# Patient Record
Sex: Female | Born: 1990 | Race: White | Hispanic: No | Marital: Married | Smoking: Current some day smoker
Health system: Southern US, Community
[De-identification: ages and names within clinical notes are randomized; demographics above are authoritative.]

## PROBLEM LIST (undated history)

## (undated) DIAGNOSIS — D649 Anemia, unspecified: Secondary | ICD-10-CM

## (undated) HISTORY — PX: WISDOM TOOTH EXTRACTION: SHX21

## (undated) HISTORY — PX: TONSILLECTOMY: SUR1361

---
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
## 2008-01-21 ENCOUNTER — Emergency Department: Payer: Self-pay | Admitting: Emergency Medicine

## 2008-05-03 ENCOUNTER — Emergency Department: Payer: Self-pay | Admitting: Emergency Medicine

## 2008-07-07 ENCOUNTER — Emergency Department: Payer: Self-pay | Admitting: Emergency Medicine

## 2010-01-31 ENCOUNTER — Emergency Department: Payer: Self-pay | Admitting: Emergency Medicine

## 2010-12-15 ENCOUNTER — Emergency Department: Payer: Self-pay | Admitting: Emergency Medicine

## 2011-01-10 NOTE — H&P (Signed)
Leah Lozano, Leah Lozano NO.:  [id number]   MEDICAL RECORD NO.:  [id number]          PATIENT TYPE:  INP   LOCATION:  0106                          FACILITY:  [facility]   PHYSICIAN:  Lalla Brothers, MDDATE OF BIRTH:  01/13/1991   DATE OF ADMISSION:  01/15/2007  DATE OF DISCHARGE:                       PSYCHIATRIC ADMISSION ASSESSMENT   IDENTIFICATION:  A 75-1/20-year-old female, eleventh grade student at  [employer] is admitted emergently voluntarily in  transfer from [facility] by  Samuel Jester for inpatient stabilization and treatment of suicide  risk and depression.  The patient's suicide plan is to kill with knives  or guns.  She ran away from grandmother's home for 24 hours and was  found by the police and brought to crisis.  The patient is defiant in  her cutting, piercing, and being suspended from school.  She will not  contract for safety.   HISTORY OF PRESENT ILLNESS:  The patient is hesitant to open up and  discuss any of her problems.  She will not discuss details of her  problematic childhood.  The patient resides with grandmother and they  report that mother works as a Research scientist (medical).  The patient is  most distressed currently by father making phone promises to reunite  with the patient in a family-type fashion and then breaking the promise  recently.  Father has abandoned the patient for many years according to  the family.  The patient has apparently had the breakup of other peer  relationships recently that recapitulate the losses of father.  The  patient has diminished concentration, diminished eating, and diminished  sleeping.  She has a suicide plan and morbid fixations.  She will not  contract for safety.  She states she does not want any medication  treatment.  She reports a fear of clowns.  She denies specific trauma.  Patient's grandmother is worried that the patient has  substance abuse  concerns though the patient denies..  The patient reportedly has seen or  is to see Dr. Ronne Binning at [facility] for assessment  and treatment.  The patient apparently does not want any part of such  assessment and treatment.  The patient seems intelligent but she  suggests she does not know much about her schoolwork or her grades.  She  suggests she will have to find out.  The patient suggests she has been  exposed to some friends with HIV and therefore she may have a number of  reminders of loss, abandonment, and trauma.   PAST MEDICAL HISTORY:  The patient is under the primary care of Dr.  Lucious Groves.  She reports exposure to friends with HIV but does not clarify  type of exposure or who the friends are.  The patient reportedly had a  recent surgical excision of a cyst in the neck or throat.  She does not  acknowledge any consequences but does not acknowledge the pathology  conclusions.  Last GYN exam was this year.  Last dental exam was last  year.  She has constipation.  She has lacerations of the left forearm  currently that are self-inflicted in her cutting.  She has some benign  ecchymoses of the right leg but does not know how she obtained it.  She  has allergy to CODEINE.  She is on no current medications.  She had no  seizure or syncope.  She had no heart murmur or arrhythmia.  She denies  other organic central nervous system trauma.   REVIEW OF SYSTEMS:  The patient denies difficulty with gait, gaze or  continence.  She denies exposure to communicable disease or toxins.  She  denies rash, jaundice or purpura.  There is no chest pain, palpitations  or presyncope currently.  There is no abdominal pain, nausea, vomiting  or diarrhea.  There is no dysuria or arthralgia.  There is no headache  or sensory loss.  There is no memory loss or coordination deficit.   IMMUNIZATIONS:  Up to date.   FAMILY HISTORY:  The patient resides with grandmother and  wants to  remain with her.  However, she was tempted to reunite with father when  he called after years of not being there for her recently.  Father did  not attend scheduled rendezvous, and the patient feels like she has  recapitulated and reexperienced her losses over and over.  The patient  has apparently moved to [place] from [place].  Mother and father  are not present in her life and the patient will not admit to the  consequences or needs.   SOCIAL AND DEVELOPMENTAL HISTORY:  The patient is an eleventh grade  student at [employer].  She reports she is currently  suspended from school.  She states she does not know about her status at  school including grades.  However, she is certainly intelligent enough  to know.  The patient does not acknowledge other legal charges  currently.  She does not acknowledge any substance abuse though  grandmother is suspicious for such.  GYN care is up to date this year  and she does not answer questions about sexual activity though she seems  likely sexually active by these indirect conclusions.  The patient does  not acknowledge use of cigarettes currently.  However, she is not open  or disclosing about most questions.   ASSETS:  The patient has some superego function evident though she  obscures and covers up that as well.   MENTAL STATUS EXAM:  Height is 63 inches and weight is 62 kg.  Blood  pressure is 106/69 with heart rate of 70 sitting, and 117/73 with heart  rate of 79 standing.  She is right-handed.  She is alert and oriented  with speech intact.  Cranial nerves, II through XII, intact.  Muscle  strength and tone are normal.  There are no pathologic reflexes or soft  neurologic findings.  There are no abnormal involuntary movements.  Gait  and gaze are intact.  The patient is retentive, obsessive, and  devaluing.  She tends to hold in strong negative emotions and stores up negative conclusions.  She has severe  dysphoria but suggests she is  currently not wanting help.  She has an avoidant style as well, likely  attended to mother's either substance use or traumatic social behavior.  The patient reports no anxiety otherwise, except the fear of clowns.  She has no psychosis or mania evident.  She has no posttraumatic stress  or organicity clearly evident.  She does have severe dysphoria  though  with reactive mood.  She has suicidal ideation though with nonspecific  plans such as guns and knives and cutting.  She is not homicidal.   IMPRESSION:  AXIS I:  1. Major depression, single episode, severe with atypical features.  2. Oppositional defiant disorder with obsessive and compulsive      features.  3. Parent-child problem.  4. Other specified family circumstances.  5. Other interpersonal problem.  6. Noncompliance with current treatment.  AXIS II:  Diagnosis deferred.  AXIS III:  1. Lacerations, left forearm.  2. Allergy to CODEINE.  3. Status post recent neck or throat surgery.  4. History of constipation.  5. Unknown exposure to friends with human immunodeficiency virus.  AXIS IV:  Stressors:  Family, severe acute and chronic; phase of life,  severe acute and chronic; school, moderate acute and chronic.  AXIS V:  Global assessment of functioning on admission 38 with highest  in the last year 73.   PLAN:  The patient is admitted for inpatient adolescent psychiatric and  multidisciplinary multimodal behavioral treatment in a team-based  problematic locked psychiatric unit.  Luvox or Celexa pharmacotherapy  can be considered though the patient currently refuses to consider any  medication.  Cognitive behavioral therapy, anger management,  interpersonal therapy, substance abuse prevention, social and  communication skill training, problem solving and coping skill training,  family intervention, grief and loss and learning strategies can be  undertaken.  Estimated length  stay is 3-5 days  with target symptoms for discharge being stabilization  of suicide risk and mood, stabilization of dangerous disruptive  behavior, and generalization of the capacity for safe effective  participation in outpatient treatment.      Lalla Brothers, MD  Electronically Signed     GEJ/MEDQ  D:  01/16/2007  T:  01/17/2007  Job:  [id number]

## 2011-01-11 ENCOUNTER — Emergency Department: Payer: Self-pay | Admitting: Emergency Medicine
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
## 2011-03-25 ENCOUNTER — Emergency Department: Payer: Self-pay | Admitting: Unknown Physician Specialty

## 2011-04-08 ENCOUNTER — Emergency Department: Payer: Self-pay | Admitting: Emergency Medicine

## 2011-09-04 ENCOUNTER — Emergency Department: Payer: Self-pay | Admitting: *Deleted

## 2012-02-23 ENCOUNTER — Emergency Department: Payer: Self-pay | Admitting: Emergency Medicine

## 2012-02-23 LAB — URINALYSIS, COMPLETE
Ketone: NEGATIVE
Leukocyte Esterase: NEGATIVE
Nitrite: NEGATIVE
Ph: 5 (ref 4.5–8.0)
Protein: 30
WBC UR: 4 /HPF (ref 0–5)

## 2012-02-23 LAB — CBC
HCT: 41.3 % (ref 35.0–47.0)
MCH: 27.3 pg (ref 26.0–34.0)
MCHC: 32.6 g/dL (ref 32.0–36.0)
Platelet: 320 10*3/uL (ref 150–440)
RBC: 4.93 10*6/uL (ref 3.80–5.20)
RDW: 14.4 % (ref 11.5–14.5)

## 2012-02-26 ENCOUNTER — Emergency Department: Payer: Self-pay | Admitting: Emergency Medicine

## 2012-02-26 ENCOUNTER — Other Ambulatory Visit: Payer: Self-pay | Admitting: Obstetrics and Gynecology

## 2012-02-26 LAB — HCG, QUANTITATIVE, PREGNANCY: Beta Hcg, Quant.: 4 m[IU]/mL — ABNORMAL HIGH

## 2012-02-26 LAB — URINALYSIS, COMPLETE
Bilirubin,UR: NEGATIVE
Ketone: NEGATIVE
Ph: 8 (ref 4.5–8.0)
Squamous Epithelial: 3

## 2012-02-26 LAB — CBC
HCT: 41.9 % (ref 35.0–47.0)
HGB: 13.4 g/dL (ref 12.0–16.0)
MCV: 84 fL (ref 80–100)
RBC: 5.02 10*6/uL (ref 3.80–5.20)
RDW: 14.3 % (ref 11.5–14.5)
WBC: 15.5 10*3/uL — ABNORMAL HIGH (ref 3.6–11.0)

## 2012-02-26 LAB — BASIC METABOLIC PANEL
Anion Gap: 5 — ABNORMAL LOW (ref 7–16)
Calcium, Total: 9 mg/dL (ref 8.5–10.1)
Co2: 28 mmol/L (ref 21–32)
Creatinine: 1.08 mg/dL (ref 0.60–1.30)
EGFR (African American): >60
EGFR (Non-African Amer.): >60
Glucose: 96 mg/dL (ref 65–99)
Osmolality: 276 (ref 275–301)

## 2012-07-16 ENCOUNTER — Ambulatory Visit: Payer: Self-pay | Admitting: Internal Medicine

## 2012-07-16 LAB — COMPREHENSIVE METABOLIC PANEL
Albumin: 4 g/dL (ref 3.4–5.0)
Alkaline Phosphatase: 88 U/L (ref 50–136)
Anion Gap: 11 (ref 7–16)
BUN: 9 mg/dL (ref 7–18)
Chloride: 102 mmol/L (ref 98–107)
Creatinine: 0.97 mg/dL (ref 0.60–1.30)
Glucose: 88 mg/dL (ref 65–99)
SGOT(AST): 21 U/L (ref 15–37)
SGPT (ALT): 32 U/L (ref 12–78)
Sodium: 141 mmol/L (ref 136–145)
Total Protein: 8 g/dL (ref 6.4–8.2)

## 2012-07-16 LAB — CBC WITH DIFFERENTIAL/PLATELET
Basophil #: 0.2 10*3/uL — ABNORMAL HIGH (ref 0.0–0.1)
Basophil %: 1.2 %
Eosinophil #: 0.4 10*3/uL (ref 0.0–0.7)
HCT: 41.6 % (ref 35.0–47.0)
HGB: 13.8 g/dL (ref 12.0–16.0)
Lymphocyte %: 23.8 %
Monocyte %: 4.3 %
Neutrophil %: 67.7 %
RDW: 14 % (ref 11.5–14.5)
WBC: 13.7 10*3/uL — ABNORMAL HIGH (ref 3.6–11.0)

## 2012-12-29 ENCOUNTER — Emergency Department: Payer: Self-pay | Admitting: Emergency Medicine

## 2013-11-30 ENCOUNTER — Emergency Department: Payer: Self-pay | Admitting: Emergency Medicine

## 2014-02-17 ENCOUNTER — Ambulatory Visit: Payer: Self-pay | Admitting: Emergency Medicine

## 2015-08-16 ENCOUNTER — Encounter: Payer: Self-pay | Admitting: Emergency Medicine

## 2015-08-16 ENCOUNTER — Emergency Department
Admission: EM | Admit: 2015-08-16 | Discharge: 2015-08-16 | Disposition: A | Discharge status: Home / Self Care | Payer: Self-pay | Attending: Emergency Medicine | Admitting: Emergency Medicine

## 2015-08-16 DIAGNOSIS — F17200 Nicotine dependence, unspecified, uncomplicated: Secondary | ICD-10-CM | POA: Insufficient documentation

## 2015-08-16 DIAGNOSIS — J157 Pneumonia due to Mycoplasma pneumoniae: Secondary | ICD-10-CM | POA: Insufficient documentation

## 2015-08-16 MED ORDER — PREDNISONE 1 MG PO TABS: 1.0000 mg | ORAL_TABLET | Freq: Every day | ORAL | Status: DC

## 2015-08-16 MED ORDER — AZITHROMYCIN 250 MG PO TABS: ORAL_TABLET | ORAL | Status: DC

## 2015-08-16 MED ORDER — ALBUTEROL SULFATE HFA 108 (90 BASE) MCG/ACT IN AERS: 2.0000 | INHALATION_SPRAY | RESPIRATORY_TRACT | Status: DC | PRN

## 2015-08-16 NOTE — ED Provider Notes (Signed)
[facility] Emergency Department Provider Note  ____________________________________________  Time seen: Approximately 2:56 PM  I have reviewed the triage vital signs and the nursing notes.   HISTORY  Chief Complaint Cough and Headache    HPI Leah Lozano is a 24 y.o. female who presents to the emergency department complaining of a cough for greater than a month. She states that she has a history of bad sinusitis and had a trip and the late fall that greatly exacerbated her sinus issues. She's been taking multiple over-the-counter medications for this. She states finally sinus issues resolved but then she developed a cough that has been going on for approximately a month to month and a half. She states that she has not had fevers or chills, difficulty breathing or swallowing. She does endorse some mild headache from coughing as well as some posttussive emesis. Patient has tried multiple over-the-counter remedies with no relief.   History reviewed. No pertinent past medical history.  There are no active problems to display for this patient.   Past Surgical History  Procedure Laterality Date  . Tonsillectomy      Current Outpatient Rx  Name  Route  Sig  Dispense  Refill  . albuterol (PROVENTIL HFA;VENTOLIN HFA) 108 (90 BASE) MCG/ACT inhaler   Inhalation   Inhale 2 puffs into the lungs every 4 (four) hours as needed for wheezing or shortness of breath.   1 Inhaler   0   . azithromycin (ZITHROMAX Z-PAK) 250 MG tablet      Take 2 tablets (500 mg) on  Day 1,  followed by 1 tablet (250 mg) once daily on Days 2 through 5.   6 each   0   . predniSONE (DELTASONE) 1 MG tablet   Oral   Take 1 tablet (1 mg total) by mouth daily.   42 tablet   0     Take 6 pills x 2 days, 5 pills x 2 days, 4 pills x ...     Allergies Coconut fragrance  No family history on file.  Social History Social History  Substance Use Topics  . Smoking status:  Current Some Day Smoker  . Smokeless tobacco: None  . Alcohol Use: No    Review of Systems Constitutional: No fever/chills Eyes: No visual changes. ENT: No sore throat. Cardiovascular: Denies chest pain. Respiratory: Denies shortness of breath. Endorses cough. Gastrointestinal: No abdominal pain.  No nausea, no vomiting.  No diarrhea.  No constipation. Genitourinary: Negative for dysuria. Musculoskeletal: Negative for back pain. Skin: Negative for rash. Neurological: Negative for headaches, focal weakness or numbness.  10-point ROS otherwise negative.  ____________________________________________   PHYSICAL EXAM:  VITAL SIGNS: ED Triage Vitals  Enc Vitals Group     BP 08/16/15 1418 116/69 mmHg     Pulse Rate 08/16/15 1418 94     Resp 08/16/15 1418 20     Temp 08/16/15 1418 98.5 F (36.9 C)     Temp Source 08/16/15 1418 Oral     SpO2 08/16/15 1418 97 %     Weight 08/16/15 1418 200 lb (90.719 kg)     Height 08/16/15 1418  (1.626 m)     Head Cir --      Peak Flow --      Pain Score --      Pain Loc --      Pain Edu? --      Excl. in GC? --     Constitutional: Alert and  oriented. Well appearing and in no acute distress. Eyes: Conjunctivae are normal. PERRL. EOMI. Head: Atraumatic. Nose: Moderate clear congestion/rhinnorhea. Mouth/Throat: Mucous membranes are moist.  Oropharynx non-erythematous. Tonsils have been surgically removed. Neck: No stridor.   Hematological/Lymphatic/Immunilogical: No cervical lymphadenopathy. Cardiovascular: Normal rate, regular rhythm. Grossly normal heart sounds.  Good peripheral circulation. Respiratory: Normal respiratory effort.  No retractions. Lungs with scattered coarse breath sounds. There is scattered expiratory wheezing noted. Gastrointestinal: Soft and nontender. No distention. No abdominal bruits. No CVA tenderness. Musculoskeletal: No lower extremity tenderness nor edema.  No joint effusions. Neurologic:  Normal speech  and language. No gross focal neurologic deficits are appreciated. No gait instability. Skin:  Skin is warm, dry and intact. No rash noted. Psychiatric: Mood and affect are normal. Speech and behavior are normal.  ____________________________________________   LABS (all labs ordered are listed, but only abnormal results are displayed)  Labs Reviewed - No data to display ____________________________________________  EKG   ____________________________________________  RADIOLOGY   ____________________________________________   PROCEDURES  Procedure(s) performed: None  Critical Care performed: No  ____________________________________________   INITIAL IMPRESSION / ASSESSMENT AND PLAN / ED COURSE  Pertinent labs & imaging results that were available during my care of the patient were reviewed by me and considered in my medical decision making (see chart for details).  Patient's diagnosis is consistent with Mycoplasma pneumonia. Advised patient of findings and diagnosis and she verbalizes understanding same. Patient is to continue over-the-counter medications for sinus issues to include Zyrtec and Flonase. Patient will be prescribed azithromycin, prednisone, and albuterol. Patient will follow-up with primary care symptoms persist past history course.   New Prescriptions   ALBUTEROL (PROVENTIL HFA;VENTOLIN HFA) 108 (90 BASE) MCG/ACT INHALER    Inhale 2 puffs into the lungs every 4 (four) hours as needed for wheezing or shortness of breath.   AZITHROMYCIN (ZITHROMAX Z-PAK) 250 MG TABLET    Take 2 tablets (500 mg) on  Day 1,  followed by 1 tablet (250 mg) once daily on Days 2 through 5.   PREDNISONE (DELTASONE) 1 MG TABLET    Take 1 tablet (1 mg total) by mouth daily.    ____________________________________________   FINAL CLINICAL IMPRESSION(S) / ED DIAGNOSES  Final diagnoses:  Mycoplasma pneumonia      Jonathan D Cuthriell, Racheal Patches-C 08/16/15 1503  Myrna Blazeravid Matthew  Schaevitz, MD 08/16/15 [phone]

## 2015-08-16 NOTE — Discharge Instructions (Signed)

## 2015-08-16 NOTE — ED Notes (Signed)
Pt presents with cough, been going on for a while cannot get in to see dr until February and also has headache from coughing so much.

## 2017-05-06 ENCOUNTER — Emergency Department
Admission: EM | Admit: 2017-05-06 | Discharge: 2017-05-06 | Disposition: A | Discharge status: Home / Self Care | Payer: Self-pay | Attending: Emergency Medicine | Admitting: Emergency Medicine

## 2017-05-06 ENCOUNTER — Encounter: Payer: Self-pay | Admitting: Emergency Medicine

## 2017-05-06 DIAGNOSIS — G43909 Migraine, unspecified, not intractable, without status migrainosus: Secondary | ICD-10-CM | POA: Insufficient documentation

## 2017-05-06 DIAGNOSIS — R112 Nausea with vomiting, unspecified: Secondary | ICD-10-CM

## 2017-05-06 DIAGNOSIS — N390 Urinary tract infection, site not specified: Secondary | ICD-10-CM | POA: Insufficient documentation

## 2017-05-06 DIAGNOSIS — F17200 Nicotine dependence, unspecified, uncomplicated: Secondary | ICD-10-CM | POA: Insufficient documentation

## 2017-05-06 DIAGNOSIS — O219 Vomiting of pregnancy, unspecified: Secondary | ICD-10-CM | POA: Insufficient documentation

## 2017-05-06 DIAGNOSIS — Z3A15 15 weeks gestation of pregnancy: Secondary | ICD-10-CM | POA: Insufficient documentation

## 2017-05-06 DIAGNOSIS — R319 Hematuria, unspecified: Secondary | ICD-10-CM

## 2017-05-06 LAB — URINALYSIS, COMPLETE (UACMP) WITH MICROSCOPIC
Bacteria, UA: NONE SEEN
Bilirubin Urine: NEGATIVE
GLUCOSE, UA: NEGATIVE mg/dL
HGB URINE DIPSTICK: NEGATIVE
KETONES UR: 20 mg/dL — AB
NITRITE: NEGATIVE
PH: 5 (ref 5.0–8.0)
PROTEIN: NEGATIVE mg/dL
Specific Gravity, Urine: 1.021 (ref 1.005–1.030)

## 2017-05-06 LAB — COMPREHENSIVE METABOLIC PANEL
ALK PHOS: 51 U/L (ref 38–126)
ALT: 16 U/L (ref 14–54)
ANION GAP: 13 (ref 5–15)
AST: 21 U/L (ref 15–41)
Albumin: 3.6 g/dL (ref 3.5–5.0)
BUN: 6 mg/dL (ref 6–20)
CALCIUM: 9.9 mg/dL (ref 8.9–10.3)
CO2: 22 mmol/L (ref 22–32)
CREATININE: 0.55 mg/dL (ref 0.44–1.00)
Chloride: 101 mmol/L (ref 101–111)
Glucose, Bld: 96 mg/dL (ref 65–99)
Potassium: 3.7 mmol/L (ref 3.5–5.1)
SODIUM: 136 mmol/L (ref 135–145)
TOTAL PROTEIN: 7.5 g/dL (ref 6.5–8.1)
Total Bilirubin: 0.5 mg/dL (ref 0.3–1.2)

## 2017-05-06 LAB — LIPASE, BLOOD: Lipase: 17 U/L (ref 11–51)

## 2017-05-06 LAB — CBC
HCT: 36.9 % (ref 35.0–47.0)
Hemoglobin: 12.4 g/dL (ref 12.0–16.0)
MCH: 27.5 pg (ref 26.0–34.0)
MCHC: 33.7 g/dL (ref 32.0–36.0)
MCV: 81.6 fL (ref 80.0–100.0)
PLATELETS: 342 10*3/uL (ref 150–440)
RBC: 4.53 MIL/uL (ref 3.80–5.20)
RDW: 13.7 % (ref 11.5–14.5)
WBC: 15.5 10*3/uL — AB (ref 3.6–11.0)

## 2017-05-06 MED ORDER — METOCLOPRAMIDE HCL 10 MG PO TABS: 10.0000 mg | ORAL_TABLET | Freq: Three times a day (TID) | ORAL | 0 refills | Status: DC | PRN

## 2017-05-06 MED ORDER — SODIUM CHLORIDE 0.9 % IV BOLUS (SEPSIS)
1000.0000 mL | Freq: Once | INTRAVENOUS | Status: AC
  Administered 2017-05-06: 1000 mL via INTRAVENOUS

## 2017-05-06 MED ORDER — KETOROLAC TROMETHAMINE 30 MG/ML IJ SOLN: 15.0000 mg | Freq: Once | INTRAMUSCULAR | Status: DC

## 2017-05-06 MED ORDER — METOCLOPRAMIDE HCL 5 MG/ML IJ SOLN
10.0000 mg | Freq: Once | INTRAMUSCULAR | Status: AC
  Administered 2017-05-06: 10 mg via INTRAVENOUS
  Filled 2017-05-06: qty 2

## 2017-05-06 MED ORDER — DIPHENHYDRAMINE HCL 50 MG/ML IJ SOLN
25.0000 mg | Freq: Once | INTRAMUSCULAR | Status: AC
  Administered 2017-05-06: 25 mg via INTRAVENOUS
  Filled 2017-05-06: qty 1

## 2017-05-06 MED ORDER — CEPHALEXIN 500 MG PO CAPS: 500.0000 mg | ORAL_CAPSULE | Freq: Two times a day (BID) | ORAL | 0 refills | Status: AC

## 2017-05-06 MED ORDER — SODIUM CHLORIDE 0.9 % IV BOLUS (SEPSIS): 1000.0000 mL | Freq: Once | INTRAVENOUS | Status: DC

## 2017-05-06 NOTE — ED Provider Notes (Signed)
[facility] Emergency Department Provider Note ____________________________________________   First MD Initiated Contact with Patient 05/06/17 [phone]     (approximate)  I have reviewed the triage vital signs and the nursing notes.   HISTORY  Chief Complaint Emesis    HPI Leah Lozano is a 26 y.o. female T2 P0 at [redacted] weeks gestation by ultrasound who presents with vomiting for one day, which is persistent in course, associated with headache and with crampy abdominal pain, and not associated with diarrhea.she states that she saw a trace amount of blood in the vomit today. She states that the headache is bitemporal and in the back of her head, throbbing, associated with photophobia and consistent with her prior migraines. She states normally her migraines get better when she goes to sleep, but this one has persisted despite her napping and sleeping last night.    History reviewed. No pertinent past medical history.  There are no active problems to display for this patient.   Past Surgical History:  Procedure Laterality Date  . TONSILLECTOMY      Prior to Admission medications   Medication Sig Start Date End Date Taking? Authorizing Provider  albuterol (PROVENTIL HFA;VENTOLIN HFA) 108 (90 BASE) MCG/ACT inhaler Inhale 2 puffs into the lungs every 4 (four) hours as needed for wheezing or shortness of breath. 08/16/15   Cuthriell, Delorise Royals, PA-C  azithromycin (ZITHROMAX Z-PAK) 250 MG tablet Take 2 tablets (500 mg) on  Day 1,  followed by 1 tablet (250 mg) once daily on Days 2 through 5. 08/16/15   Cuthriell, Delorise Royals, PA-C  cephALEXin (KEFLEX) 500 MG capsule Take 1 capsule (500 mg total) by mouth 2 (two) times daily. 05/06/17 05/13/17  Dionne Bucy, MD  metoCLOPramide (REGLAN) 10 MG tablet Take 1 tablet (10 mg total) by mouth every 8 (eight) hours as needed for nausea or vomiting. 05/06/17 05/06/18  Dionne Bucy, MD  predniSONE (DELTASONE) 1 MG  tablet Take 1 tablet (1 mg total) by mouth daily. 08/16/15   Cuthriell, Delorise Royals, PA-C    Allergies Coconut fragrance  History reviewed. No pertinent family history.  Social History Social History  Substance Use Topics  . Smoking status: Current Some Day Smoker  . Smokeless tobacco: Never Used  . Alcohol use No    Review of Systems  Constitutional: No fever/chills Eyes: No visual changes. ENT: No sore throat. Cardiovascular: Denies chest pain. Respiratory: Denies shortness of breath. Gastrointestinal: positive for nausea and vomiting. Negative for diarrhea. Positive for abdominal cramping. Genitourinary: Negative for dysuria.  Musculoskeletal: Negative for back pain. Skin: Negative for rash. Neurological: positive for headache, negative for focal weakness or numbness.   ____________________________________________   PHYSICAL EXAM:  VITAL SIGNS: ED Triage Vitals  Enc Vitals Group     BP 05/06/17 0917 (!) 98/56     Pulse Rate 05/06/17 0917 88     Resp 05/06/17 0917 20     Temp 05/06/17 0917 98.6 F (37 C)     Temp Source 05/06/17 0917 Oral     SpO2 05/06/17 0917 98 %     Weight 05/06/17 0908 224 lb (101.6 kg)     Height 05/06/17 0908  (1.626 m)     Head Circumference --      Peak Flow --      Pain Score 05/06/17 0908 7     Pain Loc --      Pain Edu? --      Excl. in GC? --  Constitutional: Alert and oriented. Well appearing and in no acute distress. Eyes: Conjunctivae are normal. EOMI. PERRLA. Slight photophobia. Head: Atraumatic. Nose: No congestion/rhinnorhea. Mouth/Throat: Mucous membranes are moist.   Neck: Normal range of motion.  Cardiovascular: Normal rate, regular rhythm. Grossly normal heart sounds.  Good peripheral circulation. Respiratory: Normal respiratory effort.  No retractions. Lungs CTAB. Gastrointestinal: Soft and nontender. No distention.  Genitourinary: No CVA tenderness. Musculoskeletal: No lower extremity edema.   Extremities warm and well perfused.  Neurologic:  Normal speech and language. No gross focal neurologic deficits are appreciated. Motor intact in all extremities. Skin:  Skin is warm and dry. No rash noted. Psychiatric: Mood and affect are normal. Speech and behavior are normal.  ____________________________________________   LABS (all labs ordered are listed, but only abnormal results are displayed)  Labs Reviewed  CBC - Abnormal; Notable for the following:       Result Value   WBC 15.5 (*)    All other components within normal limits  URINALYSIS, COMPLETE (UACMP) WITH MICROSCOPIC - Abnormal; Notable for the following:    Color, Urine AMBER (*)    APPearance HAZY (*)    Ketones, ur 20 (*)    Leukocytes, UA TRACE (*)    Squamous Epithelial / LPF 6-30 (*)    All other components within normal limits  LIPASE, BLOOD  COMPREHENSIVE METABOLIC PANEL   ____________________________________________  EKG   ____________________________________________  RADIOLOGY    ____________________________________________   PROCEDURES  Procedure(s) performed: No    Critical Care performed: No ____________________________________________   INITIAL IMPRESSION / ASSESSMENT AND PLAN / ED COURSE  Pertinent labs & imaging results that were available during my care of the patient were reviewed by me and considered in my medical decision making (see chart for details).  26 y/o F G2P0 at 15 weeks (by ultrasound) [place] with nausea and vomiting for one day which is worse than the morning sickness she has had previously during this pregnancy, associated with headache that is similar to prior migraines but lasting longer than usual. Vital signs are normal except for borderline blood pressure, patient is relatively well-appearing, and exam is otherwise unremarkable, with nonfocal neuro exam. There is slight photophobia which she has had during previous migraines as well, but no meningeal signs.  Abdomen is soft and nontender. Patient did report a trace amount of blood in the vomit that there is no sustained hematemesis. Overall suspect most likely hyperemesis, migraine, or combination of the two, also possible acute gastroenteritis or gastritis. Patient has mild abdominal cramping but no sustained abdominal pain and her abdominal exam is reassuring. She has prior ultrasound confirming IUP.  plan: Basic labs to r/o electrolyte abnormalities or anemia, fluids, symptomatic treatment with Reglan, and reassess.    ----------------------------------------- 1:56 PM on 05/06/2017 -----------------------------------------  Patient is feeling much better and was able to eat. Vital signs remained stable. She feels well to go home. Workup reveals normal electrolytes but there is evidence of urinary tract infection. Will d/c home with rx for reglan and for antibiotic.  Pt will f/u with her ob/gyn.  Return precautions given.   ____________________________________________   FINAL CLINICAL IMPRESSION(S) / ED DIAGNOSES  Final diagnoses:  Nausea and vomiting, intractability of vomiting not specified, unspecified vomiting type  Urinary tract infection with hematuria, site unspecified  Migraine without status migrainosus, not intractable, unspecified migraine type      NEW MEDICATIONS STARTED DURING THIS VISIT:  Discharge Medication List as of 05/06/2017  2:10 PM  START taking these medications   Details  cephALEXin (KEFLEX) 500 MG capsule Take 1 capsule (500 mg total) by mouth 2 (two) times daily., Starting Sun 05/06/2017, Until Sun 05/13/2017, Print    metoCLOPramide (REGLAN) 10 MG tablet Take 1 tablet (10 mg total) by mouth every 8 (eight) hours as needed for nausea or vomiting., Starting Sun 05/06/2017, Until Mon 05/06/2018, Print         Note:  This document was prepared using Dragon voice recognition software and may include unintentional dictation errors.    Dionne BucySiadecki, Amayrani Bennick,  MD 05/06/17 [phone]

## 2017-05-06 NOTE — Discharge Instructions (Signed)
Return to the ER for new or worsening vomiting, persistent vomiting or inability to tolerate anything by mouth, fever, flank pain, worsening or persistent abdominal pain, or any other new or worsening symptoms that concern you. Follow-up with your OB/GYN.

## 2017-05-06 NOTE — ED Triage Notes (Addendum)
Pt c/o migraine since Sunday.  Has had intermittent vomiting all pregnancy "that is normal pregnancy vomiting" but his has been worse.  Reports vomiting since yesterday and now saw some blood in it.  Denies diarrhea.  Is [redacted] weeks pregnant with twins.  G2A1.  Has had some abdominal cramping.

## 2017-05-06 NOTE — ED Notes (Addendum)
Pt states that she is [redacted] weeks pregnant with twins and her headache is unable to subside.  She also cannot keep food or medication down.

## 2017-10-17 ENCOUNTER — Other Ambulatory Visit: Payer: Self-pay

## 2017-10-17 ENCOUNTER — Encounter: Payer: Self-pay | Admitting: *Deleted

## 2017-10-17 ENCOUNTER — Emergency Department: Payer: BLUE CROSS/BLUE SHIELD

## 2017-10-17 ENCOUNTER — Inpatient Hospital Stay
Admission: EM | Admit: 2017-10-17 | Discharge: 2017-10-18 | DRG: 776 | Disposition: A | Discharge status: Home / Self Care | Payer: BLUE CROSS/BLUE SHIELD | Attending: Internal Medicine | Admitting: Internal Medicine

## 2017-10-17 DIAGNOSIS — D649 Anemia, unspecified: Secondary | ICD-10-CM | POA: Diagnosis present

## 2017-10-17 DIAGNOSIS — O9081 Anemia of the puerperium: Secondary | ICD-10-CM | POA: Diagnosis present

## 2017-10-17 DIAGNOSIS — R600 Localized edema: Secondary | ICD-10-CM | POA: Diagnosis present

## 2017-10-17 DIAGNOSIS — O903 Peripartum cardiomyopathy: Principal | ICD-10-CM | POA: Diagnosis present

## 2017-10-17 DIAGNOSIS — R0600 Dyspnea, unspecified: Secondary | ICD-10-CM | POA: Diagnosis present

## 2017-10-17 DIAGNOSIS — O99335 Smoking (tobacco) complicating the puerperium: Secondary | ICD-10-CM | POA: Diagnosis present

## 2017-10-17 DIAGNOSIS — O1205 Gestational edema, complicating the puerperium: Secondary | ICD-10-CM | POA: Diagnosis present

## 2017-10-17 DIAGNOSIS — J810 Acute pulmonary edema: Secondary | ICD-10-CM | POA: Diagnosis present

## 2017-10-17 HISTORY — DX: Anemia, unspecified: D64.9

## 2017-10-17 LAB — CBC
HCT: 27.5 % — ABNORMAL LOW (ref 35.0–47.0)
HEMOGLOBIN: 8.6 g/dL — AB (ref 12.0–16.0)
MCH: 23.3 pg — ABNORMAL LOW (ref 26.0–34.0)
MCHC: 31.2 g/dL — AB (ref 32.0–36.0)
MCV: 74.6 fL — ABNORMAL LOW (ref 80.0–100.0)
Platelets: 364 10*3/uL (ref 150–440)
RBC: 3.68 MIL/uL — ABNORMAL LOW (ref 3.80–5.20)
RDW: 16.9 % — AB (ref 11.5–14.5)
WBC: 11.4 10*3/uL — AB (ref 3.6–11.0)

## 2017-10-17 LAB — TROPONIN I

## 2017-10-17 LAB — BASIC METABOLIC PANEL
ANION GAP: 10 (ref 5–15)
BUN: 11 mg/dL (ref 6–20)
CALCIUM: 8.5 mg/dL — AB (ref 8.9–10.3)
CO2: 22 mmol/L (ref 22–32)
Chloride: 111 mmol/L (ref 101–111)
Creatinine, Ser: 0.59 mg/dL (ref 0.44–1.00)
GFR calc Af Amer: >60 mL/min (ref >60)
GFR calc non Af Amer: >60 mL/min (ref >60)
Glucose, Bld: 93 mg/dL (ref 65–99)
Potassium: 3.5 mmol/L (ref 3.5–5.1)
SODIUM: 143 mmol/L (ref 135–145)

## 2017-10-17 LAB — HEPATIC FUNCTION PANEL
ALK PHOS: 105 U/L (ref 38–126)
ALT: 39 U/L (ref 14–54)
AST: 35 U/L (ref 15–41)
Albumin: 2.8 g/dL — ABNORMAL LOW (ref 3.5–5.0)
BILIRUBIN TOTAL: 0.6 mg/dL (ref 0.3–1.2)
Bilirubin, Direct: <0.1 mg/dL — ABNORMAL LOW (ref 0.1–0.5)
Total Protein: 6.4 g/dL — ABNORMAL LOW (ref 6.5–8.1)

## 2017-10-17 LAB — PROTIME-INR
INR: 0.95
PROTHROMBIN TIME: 12.6 s (ref 11.4–15.2)

## 2017-10-17 LAB — BRAIN NATRIURETIC PEPTIDE: B NATRIURETIC PEPTIDE 5: 371 pg/mL — AB (ref 0.0–100.0)

## 2017-10-17 MED ORDER — ACETAMINOPHEN 650 MG RE SUPP: 650.0000 mg | Freq: Four times a day (QID) | RECTAL | Status: DC | PRN

## 2017-10-17 MED ORDER — FUROSEMIDE 10 MG/ML IJ SOLN
40.0000 mg | Freq: Once | INTRAMUSCULAR | Status: AC
  Administered 2017-10-17: 40 mg via INTRAVENOUS
  Filled 2017-10-17: qty 4

## 2017-10-17 MED ORDER — ONDANSETRON HCL 4 MG PO TABS: 4.0000 mg | ORAL_TABLET | Freq: Four times a day (QID) | ORAL | Status: DC | PRN

## 2017-10-17 MED ORDER — DOCUSATE SODIUM 100 MG PO CAPS
100.0000 mg | ORAL_CAPSULE | Freq: Two times a day (BID) | ORAL | Status: DC
  Administered 2017-10-17: 100 mg via ORAL
  Filled 2017-10-17: qty 1

## 2017-10-17 MED ORDER — FUROSEMIDE 10 MG/ML IJ SOLN
20.0000 mg | Freq: Once | INTRAMUSCULAR | Status: AC
  Administered 2017-10-18: 20 mg via INTRAVENOUS
  Filled 2017-10-17: qty 2

## 2017-10-17 MED ORDER — FERROUS SULFATE 325 (65 FE) MG PO TABS
325.0000 mg | ORAL_TABLET | Freq: Two times a day (BID) | ORAL | Status: DC
  Administered 2017-10-18: 325 mg via ORAL
  Filled 2017-10-17: qty 1

## 2017-10-17 MED ORDER — OXYCODONE HCL 5 MG PO TABS: 5.0000 mg | ORAL_TABLET | ORAL | Status: DC | PRN

## 2017-10-17 MED ORDER — ENOXAPARIN SODIUM 40 MG/0.4ML ~~LOC~~ SOLN
40.0000 mg | Freq: Two times a day (BID) | SUBCUTANEOUS | Status: DC
  Administered 2017-10-17 – 2017-10-18 (×2): 40 mg via SUBCUTANEOUS
  Filled 2017-10-17 (×2): qty 0.4

## 2017-10-17 MED ORDER — ACETAMINOPHEN 325 MG PO TABS
650.0000 mg | ORAL_TABLET | Freq: Four times a day (QID) | ORAL | Status: DC | PRN
  Administered 2017-10-18 (×2): 650 mg via ORAL
  Filled 2017-10-17 (×2): qty 2

## 2017-10-17 MED ORDER — BREAST MILK
ORAL | Status: DC
  Filled 2017-10-17: qty 1

## 2017-10-17 MED ORDER — ONDANSETRON HCL 4 MG/2ML IJ SOLN: 4.0000 mg | Freq: Four times a day (QID) | INTRAMUSCULAR | Status: DC | PRN

## 2017-10-17 MED ORDER — IOPAMIDOL (ISOVUE-370) INJECTION 76%
100.0000 mL | Freq: Once | INTRAVENOUS | Status: AC | PRN
  Administered 2017-10-17: 100 mL via INTRAVENOUS

## 2017-10-17 MED ORDER — LEVOTHYROXINE SODIUM 50 MCG PO TABS
75.0000 ug | ORAL_TABLET | Freq: Every day | ORAL | Status: DC
  Administered 2017-10-18: 08:00:00 75 ug via ORAL
  Filled 2017-10-17: qty 1

## 2017-10-17 NOTE — H&P (Signed)
[facility] - [place] at [facility]   PATIENT NAME: Leah Lozano    MR#:  [id number]  DATE OF BIRTH:  [birth year]  DATE OF ADMISSION:  10/17/2017  PRIMARY CARE PHYSICIAN: Patient, No Pcp Per   REQUESTING/REFERRING PHYSICIAN: Alphonzo Lemmings, MD  CHIEF COMPLAINT:   Chief Complaint  Patient presents with  . Shortness of Breath    HISTORY OF PRESENT ILLNESS:  Leah Lozano  is a 27 y.o. female who presents with progressive dyspnea on exertion, orthopnea, or lower extremity edema.  Patient is 1 week postpartum.  She states that during the first 2 trimesters of her pregnancy she gained about 24-25 pounds.  She states that during the last trimester she gained around 50 pounds.  She had significant lower extremity edema building at that time.  She does not have a history of preeclampsia during pregnancy.  Here in the ED her BNP was elevated raising suspicion for possible postpartum cardiomyopathy.  Hospitalist were called for admission and further evaluation  PAST MEDICAL HISTORY:   Past Medical History:  Diagnosis Date  . Anemia     PAST SURGICAL HISTORY:   Past Surgical History:  Procedure Laterality Date  . CESAREAN SECTION    . TONSILLECTOMY      SOCIAL HISTORY:   Social History   Tobacco Use  . Smoking status: Current Some Day Smoker  . Smokeless tobacco: Never Used  Substance Use Topics  . Alcohol use: No    FAMILY HISTORY:   Family History  Problem Relation Age of Onset  . Hashimoto's thyroiditis Mother   . Rosacea Mother   . Hypertension Father     DRUG ALLERGIES:   Allergies  Allergen Reactions  . Cedar Leaf Oil Other (See Comments)  . Coconut Fragrance   . Coconut Oil Cough  . Codeine Nausea Only     Skin burns  . Hydrocodone Nausea And Vomiting  . Red Maple (Acer Rubrum) Allergy Skin Test Other (See Comments)    Allergy testing +  . Rye Grass Flower Pollen Extract  [Gramineae Pollens] Cough  . Other Rash and Other (See  Comments)    Allergy testing +    MEDICATIONS AT HOME:   Prior to Admission medications   Medication Sig Start Date End Date Taking? Authorizing Provider  DOK 100 MG capsule Take 1 capsule by mouth 2 (two) times daily. 10/12/17  Yes [provider]  enoxaparin (LOVENOX) 60 MG/0.6ML injection Inject 0.6 mLs into the skin at bedtime. 10/13/17  Yes [provider]  ferrous sulfate 324 (65 Fe) MG TBEC Take 1 tablet by mouth every morning. 10/12/17  Yes [provider]  ibuprofen (ADVIL,MOTRIN) 600 MG tablet Take 1 tablet by mouth every 6 (six) hours. 10/12/17  Yes [provider]  levothyroxine (SYNTHROID, LEVOTHROID) 50 MCG tablet Take 1.5 tablets by mouth daily. 10/08/17  Yes [provider]  oxyCODONE (OXY IR/ROXICODONE) 5 MG immediate release tablet Take 1 tablet by mouth every 4 (four) hours as needed. 10/12/17  Yes [provider]    REVIEW OF SYSTEMS:  Review of Systems  Constitutional: Negative for chills, fever, malaise/fatigue and weight loss.  HENT: Negative for ear pain, hearing loss and tinnitus.   Eyes: Negative for blurred vision, double vision, pain and redness.  Respiratory: Positive for shortness of breath. Negative for cough and hemoptysis.   Cardiovascular: Positive for orthopnea and leg swelling. Negative for chest pain and palpitations.  Gastrointestinal: Negative for abdominal pain, constipation, diarrhea, nausea  and vomiting.  Genitourinary: Negative for dysuria, frequency and hematuria.  Musculoskeletal: Negative for back pain, joint pain and neck pain.  Skin:       No acne, rash, or lesions  Neurological: Negative for dizziness, tremors, focal weakness and weakness.  Endo/Heme/Allergies: Negative for polydipsia. Does not bruise/bleed easily.  Psychiatric/Behavioral: Negative for depression. The patient is not nervous/anxious and does not have insomnia.      VITAL SIGNS:   Vitals:   10/17/17 1828 10/17/17 1854   BP: 137/84   Pulse: 75   Resp: 18   Temp: 99.1 F (37.3 C)   TempSrc: Oral   SpO2: 99%   Weight:  113.4 kg (250 lb)  Height:  5\' 4"  (1.626 m)   Wt Readings from Last 3 Encounters:  10/17/17 113.4 kg (250 lb)  05/06/17 101.6 kg (224 lb)  08/16/15 90.7 kg (200 lb)    PHYSICAL EXAMINATION:  Physical Exam  Vitals reviewed. Constitutional: She is oriented to person, place, and time. She appears well-developed and well-nourished. No distress.  HENT:  Head: Normocephalic and atraumatic.  Mouth/Throat: Oropharynx is clear and moist.  Eyes: Conjunctivae and EOM are normal. Pupils are equal, round, and reactive to light. No scleral icterus.  Neck: Normal range of motion. Neck supple. No JVD present. No thyromegaly present.  Cardiovascular: Normal rate, regular rhythm and intact distal pulses. Exam reveals no gallop and no friction rub.  No murmur heard. Respiratory: Effort normal. No respiratory distress. She has no wheezes. She has rales.  GI: Soft. Bowel sounds are normal. She exhibits no distension. There is no tenderness.  Musculoskeletal: Normal range of motion. She exhibits edema.  No arthritis, no gout  Lymphadenopathy:    She has no cervical adenopathy.  Neurological: She is alert and oriented to person, place, and time. No cranial nerve deficit.  No dysarthria, no aphasia  Skin: Skin is warm and dry. No rash noted. No erythema.  Psychiatric: She has a normal mood and affect. Her behavior is normal. Judgment and thought content normal.    LABORATORY PANEL:   CBC Recent Labs  Lab 10/17/17 1823  WBC 11.4*  HGB 8.6*  HCT 27.5*  PLT 364   ------------------------------------------------------------------------------------------------------------------  Chemistries  Recent Labs  Lab 10/17/17 1823  NA 143  K 3.5  CL 111  CO2 22  GLUCOSE 93  BUN 11  CREATININE 0.59  CALCIUM 8.5*  AST 35  ALT 39  ALKPHOS 105  BILITOT 0.6    ------------------------------------------------------------------------------------------------------------------  Cardiac Enzymes Recent Labs  Lab 10/17/17 1823  TROPONINI <0.03   ------------------------------------------------------------------------------------------------------------------  RADIOLOGY:  Dg Chest 2 View  Result Date: 10/17/2017 CLINICAL DATA:  27 year old female with history of shortness of breath since Saturday. Wheezing last night. Chest pain in the center of the chest. EXAM: CHEST  2 VIEW COMPARISON:  Chest x-ray 12/29/2012. FINDINGS: Mild diffuse peribronchial cuffing. Lung volumes are normal. No consolidative airspace disease. No pleural effusions. No pneumothorax. No pulmonary nodule or mass noted. Pulmonary vasculature is normal. Heart size is borderline enlarged. Upper mediastinal contours are within normal limits. IMPRESSION: 1. Mild diffuse peribronchial cuffing, concerning for an acute bronchitis. 2. Borderline cardiomegaly. Electronically Signed   By: Trudie Reedaniel  Entrikin M.D.   On: 10/17/2017 18:44   Ct Angio Chest Pe W And/or Wo Contrast  Result Date: 10/17/2017 CLINICAL DATA:  Sudden onset of shortness of breath, progressive. One week postpartum. EXAM: CT ANGIOGRAPHY CHEST WITH CONTRAST TECHNIQUE: Multidetector CT imaging of the chest was performed using the  standard protocol during bolus administration of intravenous contrast. Multiplanar CT image reconstructions and MIPs were obtained to evaluate the vascular anatomy. CONTRAST:  ISOVUE-370 IOPAMIDOL (ISOVUE-370) INJECTION 76% COMPARISON:  Radiograph earlier this day. FINDINGS: Cardiovascular: There are no filling defects within the pulmonary arteries to suggest pulmonary embolus. Thoracic aorta is normal in caliber without dissection. The heart is normal in size. No pericardial effusion. Mediastinum/Nodes: Prominent prevascular node measures 12 mm, likely reactive. Soft tissue density in the anterior  mediastinum likely residual or recurrent thymus. The esophagus is decompressed. No visualized thyroid nodule. Lungs/Pleura: Small bilateral pleural effusions. Small amount of fluid in the into liver fissure on the left. Mild peribronchial thickening. Perihilar ground-glass opacities in the lower lobes with smooth septal thickening consistent with pulmonary edema. No confluent airspace disease. Trachea mainstem bronchi are patent. Upper Abdomen: Prominent spleen partially included measuring at least 14.6 cm AP. Musculoskeletal: There are no acute or suspicious osseous abnormalities. Review of the MIP images confirms the above findings. IMPRESSION: 1. No pulmonary embolus. 2. Small bilateral pleural effusions in fluid in the fissure. Mild pulmonary edema with septal thickening and ground-glass opacities. Peribronchial thickening likely secondary to pulmonary edema. Findings are consistent with volume overload. Electronically Signed   By: Rubye Oaks M.D.   On: 10/17/2017 21:10    EKG:   Orders placed or performed during the hospital encounter of 10/17/17  . ED EKG within 10 minutes  . ED EKG within 10 minutes  . EKG 12-Lead  . EKG 12-Lead  . EKG 12-Lead  . EKG 12-Lead    IMPRESSION AND PLAN:  Principal Problem:   Dyspnea -patient had some mild amount of pulmonary edema on chest x-ray and CT scan.  She was given IV Lasix in the ED, will repeat another dose later on tomorrow morning.  Strong suspicion for heart failure or postpartum cardiomyopathy.  Echocardiogram and cardiology consult ordered Active Problems:   Lower extremity edema -likely due to the same etiology as above, see above for treatment   Postpartum state -patient had been given some ibuprofen as part of her postpartum course.  We will avoid NSAIDs for now given the possibility for cardiac pathology above to be involved in this patient's case at this time.  They are open to consult if we need them for anything.   Anemia -patient  developed significant anemia status post C-section.  Hemoglobin is greater than 8, we will not transfuse at this time.  We will start her on iron tablets  All the records are reviewed and case discussed with ED provider. Management plans discussed with the patient and/or family.  DVT PROPHYLAXIS: SubQ lovenox  GI PROPHYLAXIS: None  ADMISSION STATUS: Inpatient  CODE STATUS: Full Code Status History    This patient does not have a recorded code status. Please follow your organizational policy for patients in this situation.      TOTAL TIME TAKING CARE OF THIS PATIENT: 45 minutes.   Mekenzie Modeste FIELDING 10/17/2017, 10:10 PM  [employer]  [phone]  CC: Primary care physician; Patient, No Pcp Per  Note:  This document was prepared using Dragon voice recognition software and may include unintentional dictation errors.

## 2017-10-17 NOTE — ED Provider Notes (Addendum)
[facility] Emergency Department Provider Note  ____________________________________________   I have reviewed the triage vital signs and the nursing notes. Where available I have reviewed prior notes and, if possible and indicated, outside hospital notes.    HISTORY  Chief Complaint Shortness of Breath    HPI Leah Lozano is a 27 y.o. female  who is healthy at baseline have a history of anemia, just gave birth to twins by C-section 1 week ago.  She states that she is having significant edema prior to the delivery.  She states that also there was significant bleeding in stated no like not to give her a transfusion.  Patient states she gained 24 pounds in the beginning part of the pregnancy then towards the end gained 50 more pounds.  She states that since going home she has had continued significant swelling in bilateral lower extremities, and she has been having shortness of breath.  She states she can hear crackles when she lies down to sleep.  She is orthopnea.  She has some dyspnea on exertion.  She denies any fever or chills or productive cough.  She does not have any significant abdomen pain, she denies dysuria, she is passing some mild lochia.  Her surgical site is intact she says she has no personal family history of PE or DVT, does hurt to take a deep breath however.  No antecedent interventions, never had this before.  Past Medical History:  Diagnosis Date  . Anemia     There are no active problems to display for this patient.   Past Surgical History:  Procedure Laterality Date  . CESAREAN SECTION    . TONSILLECTOMY      Prior to Admission medications   Medication Sig Start Date End Date Taking? Authorizing Provider  albuterol (PROVENTIL HFA;VENTOLIN HFA) 108 (90 BASE) MCG/ACT inhaler Inhale 2 puffs into the lungs every 4 (four) hours as needed for wheezing or shortness of breath. 08/16/15   Cuthriell, Delorise RoyalsJonathan D, PA-C  azithromycin  (ZITHROMAX Z-PAK) 250 MG tablet Take 2 tablets (500 mg) on  Day 1,  followed by 1 tablet (250 mg) once daily on Days 2 through 5. 08/16/15   Cuthriell, Delorise RoyalsJonathan D, PA-C  metoCLOPramide (REGLAN) 10 MG tablet Take 1 tablet (10 mg total) by mouth every 8 (eight) hours as needed for nausea or vomiting. 05/06/17 05/06/18  Dionne BucySiadecki, Sebastian, MD  predniSONE (DELTASONE) 1 MG tablet Take 1 tablet (1 mg total) by mouth daily. 08/16/15   Cuthriell, Delorise RoyalsJonathan D, PA-C    Allergies Coconut fragrance  No family history on file.  Social History Social History   Tobacco Use  . Smoking status: Current Some Day Smoker  . Smokeless tobacco: Never Used  Substance Use Topics  . Alcohol use: No  . Drug use: Not on file    Review of Systems Constitutional: No fever/chills Eyes: No visual changes. ENT: No sore throat. No stiff neck no neck pain Cardiovascular: see hpi Respiratory: see hpi Gastrointestinal:   no vomiting.  No diarrhea.  No constipation. Genitourinary: Negative for dysuria. Musculoskeletal: + lower extremity swelling Skin: Negative for rash. Neurological: Negative for severe headaches, focal weakness or numbness.   ____________________________________________   PHYSICAL EXAM:  VITAL SIGNS: ED Triage Vitals  Enc Vitals Group     BP 10/17/17 1828 137/84     Pulse Rate 10/17/17 1828 75     Resp 10/17/17 1828 18     Temp 10/17/17 1828 99.1 F (37.3 C)  Temp Source 10/17/17 1828 Oral     SpO2 10/17/17 1828 99 %     Weight 10/17/17 1854 250 lb (113.4 kg)     Height 10/17/17 1854 5\' 4"  (1.626 m)     Head Circumference --      Peak Flow --      Pain Score 10/17/17 1854 3     Pain Loc --      Pain Edu? --      Excl. in GC? --     Constitutional: Alert and oriented. Well appearing and in no acute distress. Eyes: Conjunctivae are normal Head: Atraumatic HEENT: No congestion/rhinnorhea. Mucous membranes are moist.  Oropharynx non-erythematous Neck:   Nontender with no  meningismus, no masses, no stridor Cardiovascular: Normal rate, regular rhythm. Grossly normal heart sounds.  Good peripheral circulation. Respiratory: He does have a little bit of increased work of breathing when she moves about the room even a little bit.  She has decreased breath sounds in the bases with occasional rales Abdominal: Soft and nontender.  Incision clean dry and intact, well approximated not erythematous mild tenderness, postoperative normal.  No distention. No guarding no rebound Back:  There is no focal tenderness or step off.  there is no midline tenderness there are no lesions noted. there is no CVA tenderness Musculoskeletal: No lower extremity tenderness, no upper extremity tenderness. No joint effusions, no DVT signs strong distal pulses significant 2-3+ bilateral symmetric edema Neurologic:  Normal speech and language. No gross focal neurologic deficits are appreciated.  Skin:  Skin is warm, dry and intact. No rash noted. Psychiatric: Mood and affect are normal. Speech and behavior are normal.  ____________________________________________   LABS (all labs ordered are listed, but only abnormal results are displayed)  Labs Reviewed  BASIC METABOLIC PANEL - Abnormal; Notable for the following components:      Result Value   Calcium 8.5 (*)    All other components within normal limits  CBC - Abnormal; Notable for the following components:   WBC 11.4 (*)    RBC 3.68 (*)    Hemoglobin 8.6 (*)    HCT 27.5 (*)    MCV 74.6 (*)    MCH 23.3 (*)    MCHC 31.2 (*)    RDW 16.9 (*)    All other components within normal limits  BRAIN NATRIURETIC PEPTIDE - Abnormal; Notable for the following components:   B Natriuretic Peptide 371.0 (*)    All other components within normal limits  TROPONIN I  PROTIME-INR  HEPATIC FUNCTION PANEL    Pertinent labs  results that were available during my care of the patient were reviewed by me and considered in my medical decision making (see  chart for details). ____________________________________________  EKG  I personally interpreted any EKGs ordered by me or triage  ____________________________________________  RADIOLOGY  Pertinent labs & imaging results that were available during my care of the patient were reviewed by me and considered in my medical decision making (see chart for details). If possible, patient and/or family made aware of any abnormal findings.  Dg Chest 2 View  Result Date: 10/17/2017 CLINICAL DATA:  27 year old female with history of shortness of breath since Saturday. Wheezing last night. Chest pain in the center of the chest. EXAM: CHEST  2 VIEW COMPARISON:  Chest x-ray 12/29/2012. FINDINGS: Mild diffuse peribronchial cuffing. Lung volumes are normal. No consolidative airspace disease. No pleural effusions. No pneumothorax. No pulmonary nodule or mass noted. Pulmonary vasculature is normal. Heart  size is borderline enlarged. Upper mediastinal contours are within normal limits. IMPRESSION: 1. Mild diffuse peribronchial cuffing, concerning for an acute bronchitis. 2. Borderline cardiomegaly. Electronically Signed   By: Trudie Reed M.D.   On: 10/17/2017 18:44   Ct Angio Chest Pe W And/or Wo Contrast  Result Date: 10/17/2017 CLINICAL DATA:  Sudden onset of shortness of breath, progressive. One week postpartum. EXAM: CT ANGIOGRAPHY CHEST WITH CONTRAST TECHNIQUE: Multidetector CT imaging of the chest was performed using the standard protocol during bolus administration of intravenous contrast. Multiplanar CT image reconstructions and MIPs were obtained to evaluate the vascular anatomy. CONTRAST:  ISOVUE-370 IOPAMIDOL (ISOVUE-370) INJECTION 76% COMPARISON:  Radiograph earlier this day. FINDINGS: Cardiovascular: There are no filling defects within the pulmonary arteries to suggest pulmonary embolus. Thoracic aorta is normal in caliber without dissection. The heart is normal in size. No pericardial effusion.  Mediastinum/Nodes: Prominent prevascular node measures 12 mm, likely reactive. Soft tissue density in the anterior mediastinum likely residual or recurrent thymus. The esophagus is decompressed. No visualized thyroid nodule. Lungs/Pleura: Small bilateral pleural effusions. Small amount of fluid in the into liver fissure on the left. Mild peribronchial thickening. Perihilar ground-glass opacities in the lower lobes with smooth septal thickening consistent with pulmonary edema. No confluent airspace disease. Trachea mainstem bronchi are patent. Upper Abdomen: Prominent spleen partially included measuring at least 14.6 cm AP. Musculoskeletal: There are no acute or suspicious osseous abnormalities. Review of the MIP images confirms the above findings. IMPRESSION: 1. No pulmonary embolus. 2. Small bilateral pleural effusions in fluid in the fissure. Mild pulmonary edema with septal thickening and ground-glass opacities. Peribronchial thickening likely secondary to pulmonary edema. Findings are consistent with volume overload. Electronically Signed   By: Rubye Oaks M.D.   On: 10/17/2017 21:10   ____________________________________________    PROCEDURES  Procedure(s) performed: None  Procedures  Critical Care performed: None  ____________________________________________   INITIAL IMPRESSION / ASSESSMENT AND PLAN / ED COURSE  Pertinent labs & imaging results that were available during my care of the patient were reviewed by me and considered in my medical decision making (see chart for details).  Patient with evidence of fluid overload.  Positive BNP concern for postpartum cardiomyopathy certainly exist.  Certainly could also would mostly be fluid overload from the pregnancy which apparently was significant.  I am concerned however that she had a rapid gain and what appears to be fluid weight towards the very end of her pregnancy.  We are going to give her Lasix I did do a CT scan to rule out PE  it is negative sats are good she is stable here she is pumping prior to the Lasix.  She will require admission for echo and diuresis.  ----------------------------------------- 9:59 PM on 10/17/2017 -----------------------------------------  Discussed with Dr. [facility] who agrees with Lasix and diuresis, patient is already mobilizing very well after Lasix here.  They have no further suggestions for this evening, they do admit admission, which we are doing.  We did offer the patient transfer to [facility] as where her care was for her pregnancy but she would prefer to stay here.  Also discussed with Dr. Valentino Saxon, we discussed the patient's vitals and all of her findings, she does not feel this is preeclamptic, she does suggest the patient needs to jettson her breast milk for 24 hours after her CT scan but Lasix would be okay.  Discussed with Dr. Anne Hahn who agrees with management and will admit.  Patient has already began to  diurese.    ____________________________________________   FINAL CLINICAL IMPRESSION(S) / ED DIAGNOSES  Final diagnoses:  Acute pulmonary edema (HCC)      This chart was dictated using voice recognition software.  Despite best efforts to proofread,  errors can occur which can change meaning.      Jeanmarie Plant, MD 10/17/17 2131    Jeanmarie Plant, MD 10/17/17 2201

## 2017-10-17 NOTE — Progress Notes (Signed)
Patient arrived to 2A Room 241. Patient denies pain and all questions answered. Patient oriented to unit and use of call bell/room phone. Skin assessment completed with Georgiann HahnKat RN and skin intact with healed C-section scar to mid lower abdomen noted. A&Ox4, VSS, and NSR on verified tele-box #40-01. Breast pump at bedside with supplies. Nursing staff will continue to monitor for any changes in patient status. Lamonte RicherKara A Shanyn Preisler, RN

## 2017-10-17 NOTE — ED Notes (Signed)
Dr. Anne HahnWillis at bedside talking to patient about admission

## 2017-10-17 NOTE — Progress Notes (Signed)
Lovenox changed to 40 mg BID for BMI >40 and CrCl >30. 

## 2017-10-17 NOTE — ED Notes (Signed)
Patient is pumping with hospital breast pump every hour or so. Patient has been informed that she will need to pump and dump due to CT scan.

## 2017-10-17 NOTE — ED Triage Notes (Signed)
Pt reports sudden onset of SOB that had worsened throughout the day. Pt is 1 week post C-section. No dizziness or lightheadedness noted. Pt reports nausea and diarrhea. Pt reports pain increases when taking a breath. Pt is able to talk without difficulty and in no acute reps distress. Pain is centralized in middle of pts back.

## 2017-10-18 ENCOUNTER — Inpatient Hospital Stay
Admit: 2017-10-18 | Discharge: 2017-10-18 | Disposition: A | Discharge status: Home / Self Care | Payer: BLUE CROSS/BLUE SHIELD | Attending: Internal Medicine | Admitting: Internal Medicine

## 2017-10-18 LAB — CBC
HEMATOCRIT: 27.4 % — AB (ref 35.0–47.0)
Hemoglobin: 8.6 g/dL — ABNORMAL LOW (ref 12.0–16.0)
MCH: 23.2 pg — AB (ref 26.0–34.0)
MCHC: 31.5 g/dL — ABNORMAL LOW (ref 32.0–36.0)
MCV: 73.7 fL — AB (ref 80.0–100.0)
Platelets: 385 10*3/uL (ref 150–440)
RBC: 3.72 MIL/uL — AB (ref 3.80–5.20)
RDW: 16.9 % — ABNORMAL HIGH (ref 11.5–14.5)
WBC: 11.1 10*3/uL — AB (ref 3.6–11.0)

## 2017-10-18 LAB — ECHOCARDIOGRAM COMPLETE
Height: 64 in
Weight: 3828.8 oz

## 2017-10-18 LAB — BASIC METABOLIC PANEL
Anion gap: 9 (ref 5–15)
BUN: 11 mg/dL (ref 6–20)
CHLORIDE: 109 mmol/L (ref 101–111)
CO2: 24 mmol/L (ref 22–32)
Calcium: 8.3 mg/dL — ABNORMAL LOW (ref 8.9–10.3)
Creatinine, Ser: 0.59 mg/dL (ref 0.44–1.00)
GFR calc non Af Amer: >60 mL/min (ref >60)
Glucose, Bld: 88 mg/dL (ref 65–99)
POTASSIUM: 3.2 mmol/L — AB (ref 3.5–5.1)
SODIUM: 142 mmol/L (ref 135–145)

## 2017-10-18 MED ORDER — SENNOSIDES-DOCUSATE SODIUM 8.6-50 MG PO TABS: 1.0000 | ORAL_TABLET | Freq: Two times a day (BID) | ORAL | Status: DC

## 2017-10-18 MED ORDER — FUROSEMIDE 10 MG/ML IJ SOLN: 20.0000 mg | Freq: Two times a day (BID) | INTRAMUSCULAR | Status: DC

## 2017-10-18 MED ORDER — POTASSIUM CHLORIDE CRYS ER 20 MEQ PO TBCR: 40.0000 meq | EXTENDED_RELEASE_TABLET | Freq: Two times a day (BID) | ORAL | Status: DC

## 2017-10-18 MED ORDER — FUROSEMIDE 20 MG PO TABS: 20.0000 mg | ORAL_TABLET | Freq: Every day | ORAL | 11 refills | Status: DC | PRN

## 2017-10-18 NOTE — Consult Note (Signed)
Reason for Consult: Shortness of breath heart failure Referring Physician: Dr. Lance Coon hospitalist Primary physician high [facility]  Leah Lozano is an 27 y.o. female.  HPI: Patient is a 27 year old female postpartum October 10, 2018 1937-week twin status post C-section.  Patient developed progressive dyspnea shortness of breath.  Patient has significant leg swelling with congestion shortness of breath dyspnea patient finally came to the emergency room for assessment.  Patient was thought to be in heart failure was treated with intravenous diuretic therapy and had significant urine output and significant improvement in symptoms now feeling better here for further cardiac assessment.  Denies any chest pain no palpitations or tachycardia she has had significant leg swelling and tremendous weight gain but now is improved after diuretic therapy.  Patient denies any previous cardiac history  Past Medical History:  Diagnosis Date  . Anemia     Past Surgical History:  Procedure Laterality Date  . CESAREAN SECTION    . TONSILLECTOMY      Family History  Problem Relation Age of Onset  . Hashimoto's thyroiditis Mother   . Rosacea Mother   . Hypertension Father     Social History:  reports that she has been smoking.  she has never used smokeless tobacco. She reports that she does not drink alcohol. Her drug history is not on file.  Allergies:  Allergies  Allergen Reactions  . Cedar Leaf Oil Other (See Comments)  . Coconut Fragrance   . Coconut Oil Cough  . Codeine Nausea Only     Skin burns  . Hydrocodone Nausea And Vomiting  . Red Maple (Acer Rubrum) Allergy Skin Test Other (See Comments)    Allergy testing +  . Rye Grass Flower Pollen Extract  [Gramineae Pollens] Cough  . Other Rash and Other (See Comments)    Allergy testing +    Medications: I have reviewed the patient's current medications.  Results for orders placed or performed during the hospital  encounter of 10/17/17 (from the past 48 hour(s))  Basic metabolic panel     Status: Abnormal   Collection Time: 10/17/17  6:23 PM  Result Value Ref Range   Sodium 143 135 - 145 mmol/L   Potassium 3.5 3.5 - 5.1 mmol/L   Chloride 111 101 - 111 mmol/L   CO2 22 22 - 32 mmol/L   Glucose, Bld 93 65 - 99 mg/dL   BUN 11 6 - 20 mg/dL   Creatinine, Ser 0.59 0.44 - 1.00 mg/dL   Calcium 8.5 (L) 8.9 - 10.3 mg/dL   GFR calc non Af Amer >60 >60 mL/min   GFR calc Af Amer >60 >60 mL/min    Comment: (NOTE) The eGFR has been calculated using the CKD EPI equation. This calculation has not been validated in all clinical situations. eGFR's persistently <60 mL/min signify possible Chronic Kidney Disease.    Anion gap 10 5 - 15    Comment: Performed at [facility], [address] [place], [place] [address]  CBC     Status: Abnormal   Collection Time: 10/17/17  6:23 PM  Result Value Ref Range   WBC 11.4 (H) 3.6 - 11.0 K/uL   RBC 3.68 (L) 3.80 - 5.20 MIL/uL   Hemoglobin 8.6 (L) 12.0 - 16.0 g/dL   HCT 27.5 (L) 35.0 - 47.0 %   MCV 74.6 (L) 80.0 - 100.0 fL   MCH 23.3 (L) 26.0 - 34.0 pg   MCHC 31.2 (L) 32.0 - 36.0 g/dL  RDW 16.9 (H) 11.5 - 14.5 %   Platelets 364 150 - 440 K/uL    Comment: Performed at [facility], [address] [place], [place] [address]  Troponin I     Status: None   Collection Time: 10/17/17  6:23 PM  Result Value Ref Range   Troponin I <0.03 <0.03 ng/mL    Comment: Performed at [facility], [address] [place], [place] [address]  Brain natriuretic peptide     Status: Abnormal   Collection Time: 10/17/17  6:23 PM  Result Value Ref Range   B Natriuretic Peptide 371.0 (H) 0.0 - 100.0 pg/mL    Comment: Performed at [facility], [address] [place], [place] [address]  Hepatic function panel     Status: Abnormal   Collection Time: 10/17/17  6:23 PM  Result Value Ref Range   Total Protein 6.4 (L) 6.5 - 8.1 g/dL   Albumin 2.8 (L)  3.5 - 5.0 g/dL   AST 35 15 - 41 U/L   ALT 39 14 - 54 U/L   Alkaline Phosphatase 105 38 - 126 U/L   Total Bilirubin 0.6 0.3 - 1.2 mg/dL   Bilirubin, Direct <0.1 (L) 0.1 - 0.5 mg/dL   Indirect Bilirubin NOT CALCULATED 0.3 - 0.9 mg/dL    Comment: Performed at [facility], [address] [place], [place] [address]  Protime-INR     Status: None   Collection Time: 10/17/17  8:21 PM  Result Value Ref Range   Prothrombin Time 12.6 11.4 - 15.2 seconds   INR 0.95     Comment: Performed at [facility], [address] [place], [place] [address]  Basic metabolic panel     Status: Abnormal   Collection Time: 10/18/17  4:16 AM  Result Value Ref Range   Sodium 142 135 - 145 mmol/L   Potassium 3.2 (L) 3.5 - 5.1 mmol/L   Chloride 109 101 - 111 mmol/L   CO2 24 22 - 32 mmol/L   Glucose, Bld 88 65 - 99 mg/dL   BUN 11 6 - 20 mg/dL   Creatinine, Ser 0.59 0.44 - 1.00 mg/dL   Calcium 8.3 (L) 8.9 - 10.3 mg/dL   GFR calc non Af Amer >60 >60 mL/min   GFR calc Af Amer >60 >60 mL/min    Comment: (NOTE) The eGFR has been calculated using the CKD EPI equation. This calculation has not been validated in all clinical situations. eGFR's persistently <60 mL/min signify possible Chronic Kidney Disease.    Anion gap 9 5 - 15    Comment: Performed at [facility], [address] [place], [place] [address]  CBC     Status: Abnormal   Collection Time: 10/18/17  4:16 AM  Result Value Ref Range   WBC 11.1 (H) 3.6 - 11.0 K/uL   RBC 3.72 (L) 3.80 - 5.20 MIL/uL   Hemoglobin 8.6 (L) 12.0 - 16.0 g/dL   HCT 27.4 (L) 35.0 - 47.0 %   MCV 73.7 (L) 80.0 - 100.0 fL   MCH 23.2 (L) 26.0 - 34.0 pg   MCHC 31.5 (L) 32.0 - 36.0 g/dL   RDW 16.9 (H) 11.5 - 14.5 %   Platelets 385 150 - 440 K/uL    Comment: Performed at [facility], [address], [place], [place] [address]    Dg Chest 2 View  Result Date: 10/17/2017 CLINICAL DATA:  27 year old female with history of shortness of  breath since  Saturday. Wheezing last night. Chest pain in the center of the chest. EXAM: CHEST  2 VIEW COMPARISON:  Chest x-ray 12/29/2012. FINDINGS: Mild diffuse peribronchial cuffing. Lung volumes are normal. No consolidative airspace disease. No pleural effusions. No pneumothorax. No pulmonary nodule or mass noted. Pulmonary vasculature is normal. Heart size is borderline enlarged. Upper mediastinal contours are within normal limits. IMPRESSION: 1. Mild diffuse peribronchial cuffing, concerning for an acute bronchitis. 2. Borderline cardiomegaly. Electronically Signed   By: Vinnie Langton M.D.   On: 10/17/2017 18:44   Ct Angio Chest Pe W And/or Wo Contrast  Result Date: 10/17/2017 CLINICAL DATA:  Sudden onset of shortness of breath, progressive. One week postpartum. EXAM: CT ANGIOGRAPHY CHEST WITH CONTRAST TECHNIQUE: Multidetector CT imaging of the chest was performed using the standard protocol during bolus administration of intravenous contrast. Multiplanar CT image reconstructions and MIPs were obtained to evaluate the vascular anatomy. CONTRAST:  115m ISOVUE-370 IOPAMIDOL (ISOVUE-370) INJECTION 76% COMPARISON:  Radiograph earlier this day. FINDINGS: Cardiovascular: There are no filling defects within the pulmonary arteries to suggest pulmonary embolus. Thoracic aorta is normal in caliber without dissection. The heart is normal in size. No pericardial effusion. Mediastinum/Nodes: Prominent prevascular node measures 12 mm, likely reactive. Soft tissue density in the anterior mediastinum likely residual or recurrent thymus. The esophagus is decompressed. No visualized thyroid nodule. Lungs/Pleura: Small bilateral pleural effusions. Small amount of fluid in the into liver fissure on the left. Mild peribronchial thickening. Perihilar ground-glass opacities in the lower lobes with smooth septal thickening consistent with pulmonary edema. No confluent airspace disease. Trachea mainstem bronchi are patent.  Upper Abdomen: Prominent spleen partially included measuring at least 14.6 cm AP. Musculoskeletal: There are no acute or suspicious osseous abnormalities. Review of the MIP images confirms the above findings. IMPRESSION: 1. No pulmonary embolus. 2. Small bilateral pleural effusions in fluid in the fissure. Mild pulmonary edema with septal thickening and ground-glass opacities. Peribronchial thickening likely secondary to pulmonary edema. Findings are consistent with volume overload. Electronically Signed   By: MJeb LeveringM.D.   On: 10/17/2017 21:10    Review of Systems  Constitutional: Positive for diaphoresis.  HENT: Positive for congestion.   Eyes: Negative.   Respiratory: Positive for shortness of breath.   Cardiovascular: Positive for orthopnea, leg swelling and PND.  Gastrointestinal: Negative.   Genitourinary: Negative.   Musculoskeletal: Negative.   Skin: Negative.   Neurological: Negative.   Endo/Heme/Allergies: Negative.   Psychiatric/Behavioral: Negative.    Blood pressure 137/79, pulse 73, temperature 97.9 F (36.6 C), temperature source Oral, resp. rate 18, height '5\' 4"'$  (1.626 m), weight 239 lb 4.8 oz (108.5 kg), SpO2 98 %, unknown if currently breastfeeding. Physical Exam  Nursing note and vitals reviewed. Constitutional: She is oriented to person, place, and time. She appears well-developed and well-nourished.  HENT:  Head: Normocephalic and atraumatic.  Eyes: Conjunctivae and EOM are normal. Pupils are equal, round, and reactive to light.  Neck: Normal range of motion. Neck supple.  Cardiovascular: Normal rate and regular rhythm. Exam reveals gallop.  Respiratory: She has wheezes. She has rales.  GI: Bowel sounds are normal.  Musculoskeletal: Normal range of motion. She exhibits edema.  Neurological: She is alert and oriented to person, place, and time. She has normal reflexes.  Skin: Skin is warm and dry.  Psychiatric: She has a normal mood and affect.     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 Plan Agree with admission to telemetry Agree with diuretic therapy Consider low-dose ACE inhibitor beta-blocker for heart failure Support stockings elevation for edema Continue levothyroxine therapy Echocardiogram for assessment of left ventricular function   Dwayne D Callwood 10/18/2017, 8:54 AM

## 2017-10-18 NOTE — Plan of Care (Signed)
  Clinical Measurements: Ability to maintain clinical measurements within normal limits will improve 10/18/2017 1344 - Not Progressing by Raynald BlendImhoff, Timothey Dahlstrom M, RN Note Potassium = only 3.2 today. Will continue to monitor lab values. Jari FavreSteven M [facility]

## 2017-10-18 NOTE — Discharge Instructions (Signed)
Pulmonary Edema °Pulmonary edema is abnormal fluid buildup in the lungs that can make it hard to breathe. °Follow these instructions at home: °· Talk to your doctor about an exercise program. °· Eat a healthy diet: °? Eat fresh fruits, vegetables, and lean meats. °? Limit high fat and salty foods. °? Avoid processed, canned, or fried foods. °? Avoid fast food. °· Follow your doctor's advice about taking medicine and recording the medicine you take. °· Follow your doctor's advice about keeping a record of your weight. °· Talk to your doctor about keeping track of your blood pressure. °· Do not smoke. °· Do not use nicotine patches or nicotine gum. °· Make a follow-up appointment with your doctor. °· Ask your doctor for a copy of your latest heart tracing (ECG) and keep a copy with you at all times. °Get help right away if: °· You have chest pain. THIS IS AN EMERGENCY. Do not wait to see if the pain will go away. Call for local emergency medical help. Do not drive yourself to the hospital. °· You have sweating, feel sick to your stomach (nauseous), or are experiencing shortness of breath. °· Your weight increases more than your doctor tells you it should. °· You start to have shortness of breath. °· You notice more swelling in your hands, feet, ankles, or belly. °· You have dizziness, blurred vision, headache, or unsteadiness that does not go away. °· You cough up bloody spit. °· You have a cough that does not go away. °· You are unable to sleep because it is hard to breathe. °· You begin to feel a “jumping” or “fluttering” sensation (palpitations) in the chest that is unusual for you. °This information is not intended to replace advice given to you by your health care provider. Make sure you discuss any questions you have with your health care provider. °Document Released: 08/02/2009 Document Revised: 01/20/2016 Document Reviewed: 04/21/2013 °Elsevier Interactive Patient Education © 2018 Elsevier Inc. ° °

## 2017-10-19 LAB — HIV ANTIBODY (ROUTINE TESTING W REFLEX): HIV Screen 4th Generation wRfx: NONREACTIVE

## 2017-10-21 NOTE — Discharge Summary (Signed)
[facility] [place] [facility]   PATIENT NAME: Leah Lozano    MR#:  [id number]  DATE OF BIRTH:  Jul 06, 1991  DATE OF ADMISSION:  10/17/2017   ADMITTING PHYSICIAN: Oralia Manisavid Willis, MD  DATE OF DISCHARGE: 10/18/2017  3:22 PM  PRIMARY CARE PHYSICIAN: Patient, No Pcp Per   ADMISSION DIAGNOSIS:  Acute pulmonary edema (HCC) [J81.0] DISCHARGE DIAGNOSIS:  Principal Problem:   Dyspnea Active Problems:   Lower extremity edema   Postpartum state   Anemia  SECONDARY DIAGNOSIS:   Past Medical History:  Diagnosis Date  . Anemia    HOSPITAL COURSE:  27 y.o. female admitted for progressive dyspnea on exertion, orthopnea, or lower extremity edema.  Patient is 1 week postpartum.  She states that during the first 2 trimesters of her pregnancy she gained about 24-25 pounds.  She states that during the last trimester she gained around 50 pounds.  She had significant lower extremity edema building at that time. 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    * Dyspnea - Likely flash pulmonary edema seen on chest x-ray and CT scan. suggestive of postpartum cardiomyopathy.  Echocardiogram normal  * Lower extremity edema -likely due to the same etiology as above  * Postpartum state: follow up with OB, stop NSAIDs if possible  * Anemia -patient developed significant anemia status post C-section.  Hemoglobin is greater than 8 so no need of transfusion. Continue Iron DISCHARGE CONDITIONS:  stable CONSULTS OBTAINED:  Treatment Team:  Alwyn Peaallwood, Dwayne D, MD DRUG ALLERGIES:   Allergies  Allergen Reactions  . Cedar Leaf Oil Other (See Comments)  . Coconut Fragrance   . Coconut Oil Cough  . Codeine Nausea Only     Skin burns  . Hydrocodone Nausea And Vomiting  . Red Maple (Acer Rubrum) Allergy Skin Test Other (See Comments)    Allergy testing +  . Rye Grass Flower Pollen Extract  [Gramineae Pollens] Cough  . Other Rash and  Other (See Comments)    Allergy testing +   DISCHARGE MEDICATIONS:   Allergies as of 10/18/2017      Reactions   Cedar Leaf Oil Other (See Comments)   Coconut Fragrance    Coconut Oil Cough   Codeine Nausea Only    Skin burns   Hydrocodone Nausea And Vomiting   Red Maple (acer Rubrum) Allergy Skin Test Other (See Comments)   Allergy testing +   Rye Grass Flower Pollen Extract  [gramineae Pollens] Cough   Other Rash, Other (See Comments)   Allergy testing +      Medication List    STOP taking these medications   ibuprofen 600 MG tablet Commonly known as:  ADVIL,MOTRIN   oxyCODONE 5 MG immediate release tablet Commonly known as:  Oxy IR/ROXICODONE     TAKE these medications   DOK 100 MG capsule Generic drug:  docusate sodium Take 1 capsule by mouth 2 (two) times daily.   enoxaparin 60 MG/0.6ML injection Commonly known as:  LOVENOX Inject 0.6 mLs into the skin at bedtime.   ferrous sulfate 324 (65 Fe) MG Tbec Take 1 tablet by mouth every morning.   furosemide 20 MG tablet Commonly known as:  LASIX Take 1 tablet (20 mg total) by mouth daily as needed for fluid or edema.   levothyroxine 50 MCG tablet Commonly known as:  SYNTHROID, LEVOTHROID Take 1.5 tablets by mouth daily.        DISCHARGE INSTRUCTIONS:  DIET:  Cardiac diet DISCHARGE CONDITION:  Good ACTIVITY:  Activity as tolerated OXYGEN:  Home Oxygen: No.  Oxygen Delivery: room air DISCHARGE LOCATION:  home   If you experience worsening of your admission symptoms, develop shortness of breath, life threatening emergency, suicidal or homicidal thoughts you must seek medical attention immediately by calling 911 or calling your MD immediately  if symptoms less severe.  You Must read complete instructions/literature along with all the possible adverse reactions/side effects for all the Medicines you take and that have been prescribed to you. Take any new Medicines after you have completely understood  and accpet all the possible adverse reactions/side effects.   Please note  You were cared for by a hospitalist during your hospital stay. If you have any questions about your discharge medications or the care you received while you were in the hospital after you are discharged, you can call the unit and asked to speak with the hospitalist on call if the hospitalist that took care of you is not available. Once you are discharged, your primary care physician will handle any further medical issues. Please note that NO REFILLS for any discharge medications will be authorized once you are discharged, as it is imperative that you return to your primary care physician (or establish a relationship with a primary care physician if you do not have one) for your aftercare needs so that they can reassess your need for medications and monitor your lab values.    On the day of Discharge:  VITAL SIGNS:  Blood pressure 137/79, pulse 73, temperature 97.9 F (36.6 C), temperature source Oral, resp. rate 18, height 5\' 4"  (1.626 m), weight 108.5 kg (239 lb 4.8 oz), SpO2 98 %, unknown if currently breastfeeding. PHYSICAL EXAMINATION:  GENERAL:  27 y.o.-year-old patient lying in the bed with no acute distress.  EYES: Pupils equal, round, reactive to light and accommodation. No scleral icterus. Extraocular muscles intact.  HEENT: Head atraumatic, normocephalic. Oropharynx and nasopharynx clear.  NECK:  Supple, no jugular venous distention. No thyroid enlargement, no tenderness.  LUNGS: Normal breath sounds bilaterally, no wheezing, rales,rhonchi or crepitation. No use of accessory muscles of respiration.  CARDIOVASCULAR: S1, S2 normal. No murmurs, rubs, or gallops.  ABDOMEN: Soft, non-tender, non-distended. Bowel sounds present. No organomegaly or mass.  EXTREMITIES: No pedal edema, cyanosis, or clubbing.  NEUROLOGIC: Cranial nerves II through XII are intact. Muscle strength 5/5 in all extremities. Sensation intact.  Gait not checked.  PSYCHIATRIC: The patient is alert and oriented x 3.  SKIN: No obvious rash, lesion, or ulcer.  DATA REVIEW:   CBC Recent Labs  Lab 10/18/17 0416  WBC 11.1*  HGB 8.6*  HCT 27.4*  PLT 385    Chemistries  Recent Labs  Lab 10/17/17 1823 10/18/17 0416  NA 143 142  K 3.5 3.2*  CL 111 109  CO2 22 24  GLUCOSE 93 88  BUN 11 11  CREATININE 0.59 0.59  CALCIUM 8.5* 8.3*  AST 35  --   ALT 39  --   ALKPHOS 105  --   BILITOT 0.6  --     Follow-up Information    Alwyn Pea, MD. Go on 10/25/2017.   Specialties:  Cardiology, Internal Medicine Why:  Appointment Time: 10:15am Contact information: [address] [facility]  - CARDIOLOGY [place] [address] [phone]           Management plans discussed with the patient, family and they are in agreement.  CODE STATUS:  Prior   TOTAL TIME TAKING CARE OF THIS PATIENT: 45 minutes.    Delfino Lovett M.D on 10/21/2017 at 1:44 PM  Between 7am to 6pm - Pager - [phone]  After 6pm go to [email or web address] - Social research officer, government  [facility]  Office  [phone]  CC: Primary care physician; Patient, No Pcp Per   Note: This dictation was prepared with Dragon dictation along with smaller phrase technology. Any transcriptional errors that result from this process are unintentional.

## 2019-09-10 IMAGING — CT CT ANGIO CHEST
2 of 6 series · 18 of 46 positions shown · IV contrast (iopamidol)
Comparison: Radiograph earlier this day.

CLINICAL DATA: Sudden onset of shortness of breath, progressive.
One week postpartum.

EXAM:
CT ANGIOGRAPHY CHEST WITH CONTRAST
TECHNIQUE: Multidetector CT imaging of the chest was performed using the
standard protocol during bolus administration of intravenous
contrast. Multiplanar CT image reconstructions and MIPs were
obtained to evaluate the vascular anatomy.
CONTRAST:  100mL E1SZMM-EAF IOPAMIDOL (E1SZMM-EAF) INJECTION 76%

[Series 5: thins · axial · 0.81mm/px · z∈[-200,+51]mm · 15 of 275 slices shown]
[im 12/275  lung]
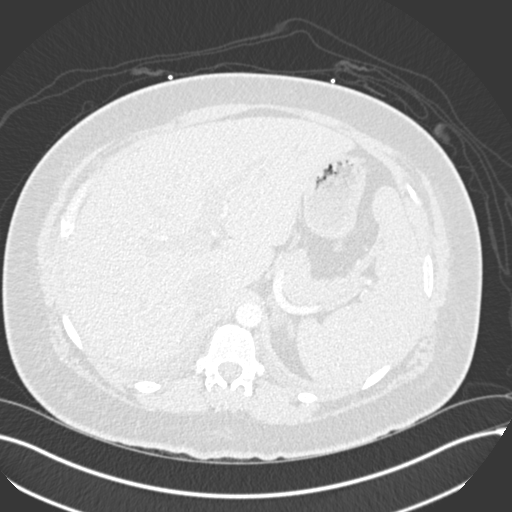
[im 36/275  soft-tissue]
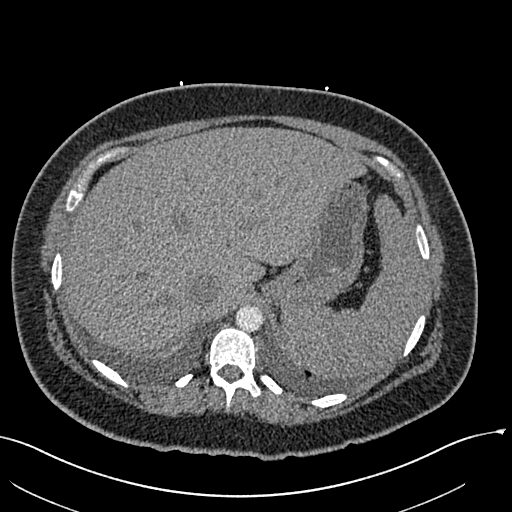
[im 48/275  lung]
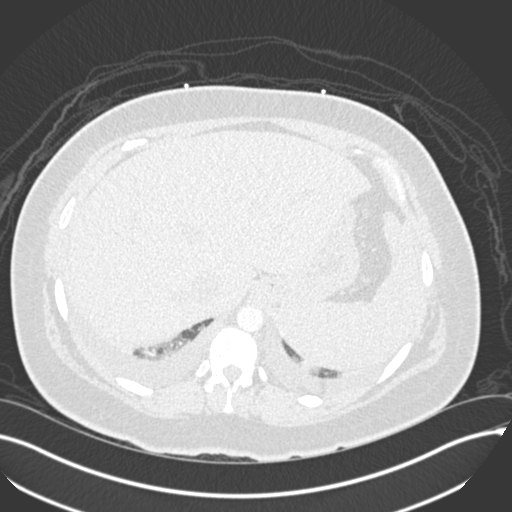
[im 72/275  soft-tissue]
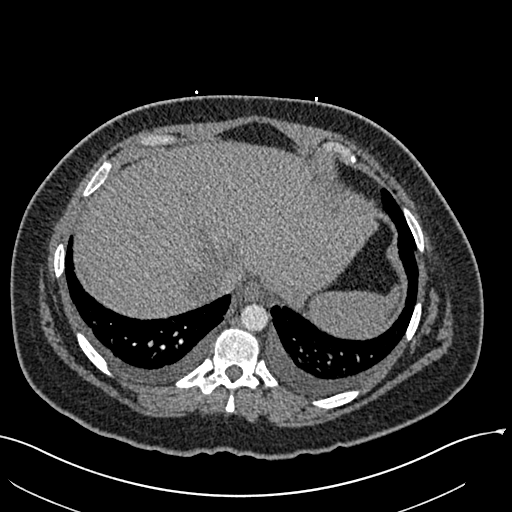
[im 84/275  lung]
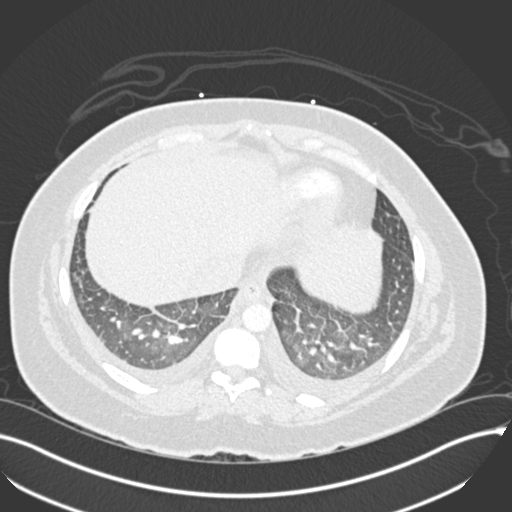
[im 108/275  soft-tissue]
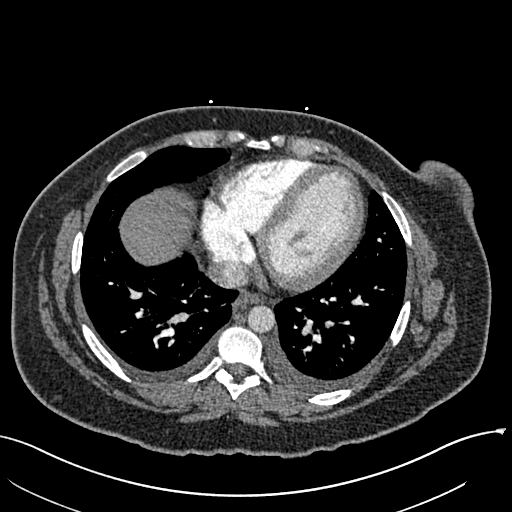
[im 120/275  lung]
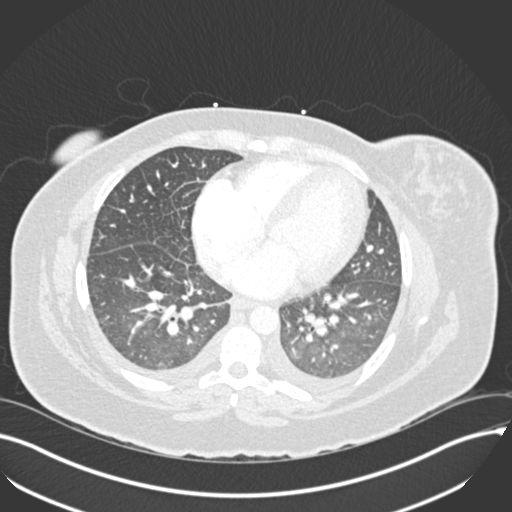
[im 143/275  soft-tissue]
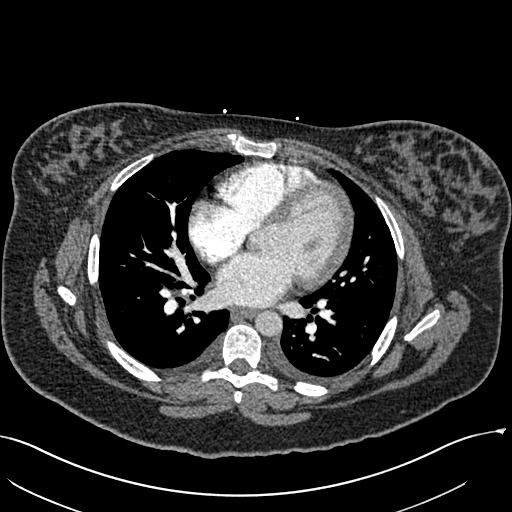
[im 155/275  lung]
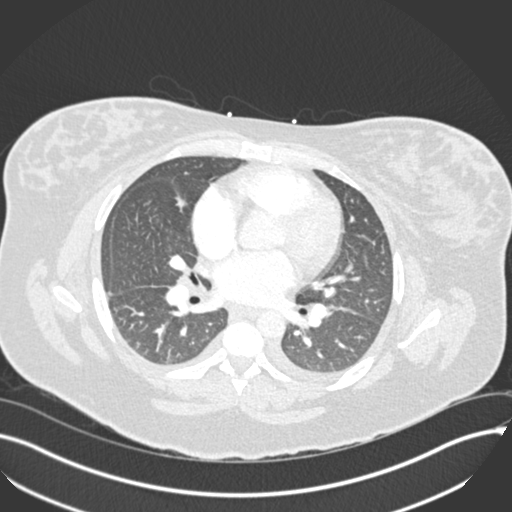
[im 167/275  soft-tissue]
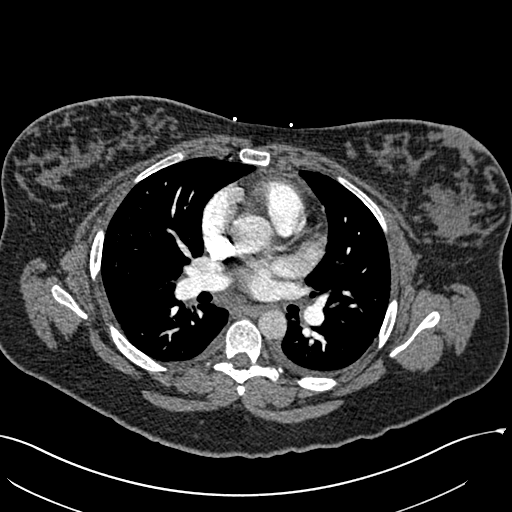
[im 191/275  lung]
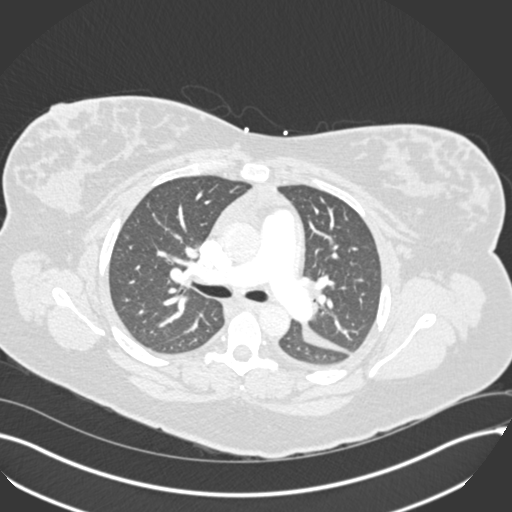
[im 203/275  soft-tissue]
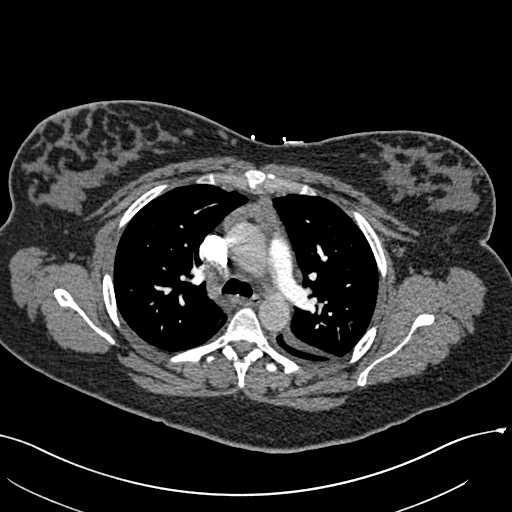
[im 227/275  lung]
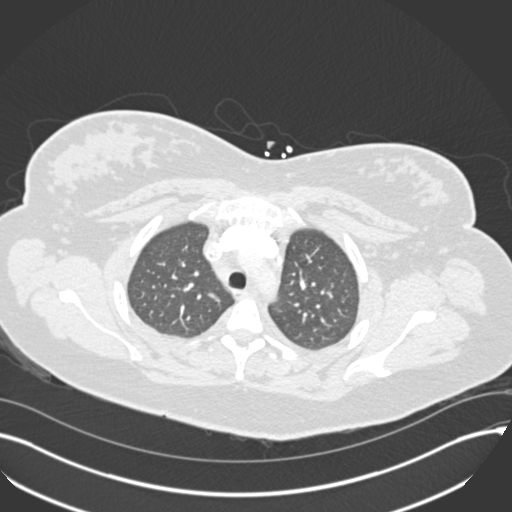
[im 239/275  soft-tissue]
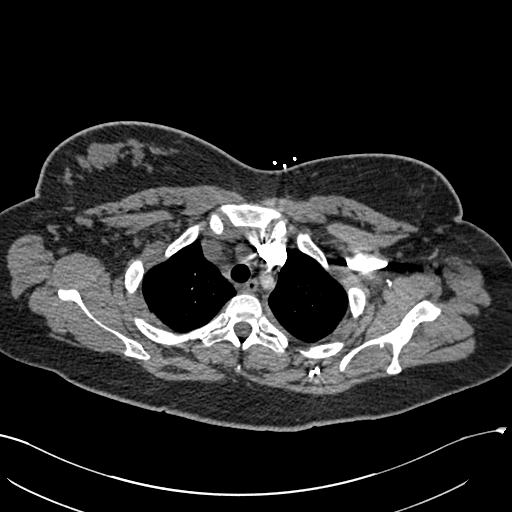
[im 263/275  lung]
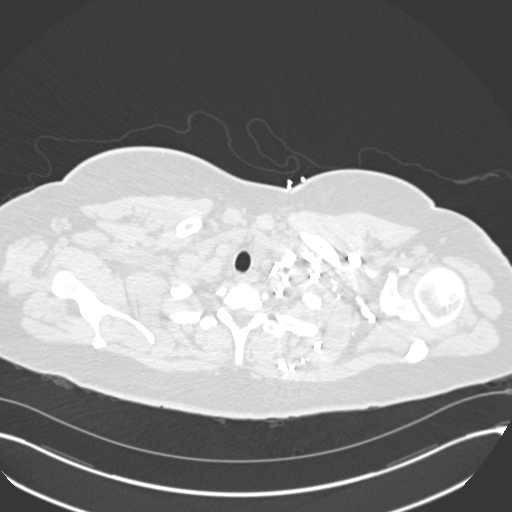

[Series 7: coronal mpr · coronal · 0.54mm/px · 3 of 92 slices shown]
[im 23/92  soft-tissue]
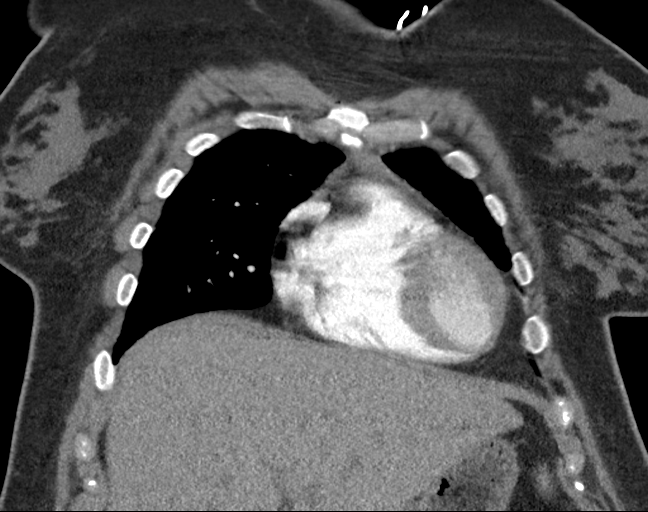
[im 46/92  soft-tissue]
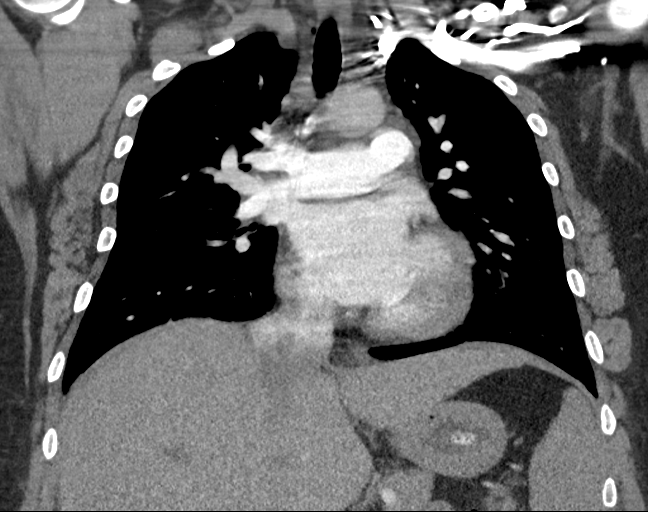
[im 69/92  soft-tissue]
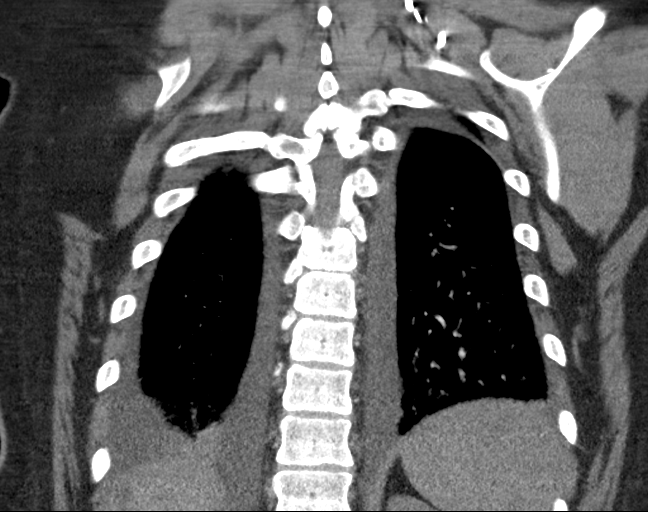

[18 of 46 positions shown; findings below may reference images not displayed]

FINDINGS: Cardiovascular: There are no filling defects within the pulmonary
arteries to suggest pulmonary embolus. Thoracic aorta is normal in
caliber without dissection. The heart is normal in size. No
pericardial effusion.

Mediastinum/Nodes: Prominent prevascular node measures 12 mm, likely
reactive. Soft tissue density in the anterior mediastinum likely
residual or recurrent thymus. The esophagus is decompressed. No
visualized thyroid nodule.

Lungs/Pleura: Small bilateral pleural effusions. Small amount of
fluid in the into liver fissure on the left. Mild peribronchial
thickening. Perihilar ground-glass opacities in the lower lobes with
smooth septal thickening consistent with pulmonary edema. No
confluent airspace disease. Trachea mainstem bronchi are patent.

Upper Abdomen: Prominent spleen partially included measuring at
least 14.6 cm AP.

Musculoskeletal: There are no acute or suspicious osseous
abnormalities.

Review of the MIP images confirms the above findings.
IMPRESSION: 1. No pulmonary embolus.
2. Small bilateral pleural effusions in fluid in the fissure. Mild
pulmonary edema with septal thickening and ground-glass opacities.
Peribronchial thickening likely secondary to pulmonary edema.
Findings are consistent with volume overload.

## 2023-06-05 ENCOUNTER — Other Ambulatory Visit: Payer: Self-pay | Admitting: Neurology

## 2023-06-05 DIAGNOSIS — M542 Cervicalgia: Secondary | ICD-10-CM

## 2023-06-22 ENCOUNTER — Ambulatory Visit
Admission: RE | Admit: 2023-06-22 | Discharge: 2023-06-22 | Disposition: A | Discharge status: Home / Self Care | Payer: BLUE CROSS/BLUE SHIELD | Source: Ambulatory Visit | Attending: Neurology | Admitting: Neurology

## 2023-06-22 DIAGNOSIS — M542 Cervicalgia: Secondary | ICD-10-CM

## 2023-07-05 ENCOUNTER — Other Ambulatory Visit: Payer: Self-pay | Admitting: Family Medicine

## 2023-07-05 ENCOUNTER — Inpatient Hospital Stay
Admission: RE | Admit: 2023-07-05 | Discharge: 2023-07-05 | Disposition: A | Discharge status: Home / Self Care | Payer: Self-pay | Source: Ambulatory Visit | Attending: Orthopedic Surgery | Admitting: Orthopedic Surgery

## 2023-07-05 ENCOUNTER — Telehealth: Payer: Self-pay | Admitting: Family Medicine

## 2023-07-05 DIAGNOSIS — Z049 Encounter for examination and observation for unspecified reason: Secondary | ICD-10-CM

## 2023-07-05 NOTE — Telephone Encounter (Signed)
Error

## 2023-07-06 NOTE — Progress Notes (Unsigned)
Referring Physician:  Lonell Face, MD [phone] [facility] [address] [facility] [place],  [place] [address]  Primary Physician:  Patient, No Pcp Per  History of Present Illness: 07/06/2023*** Leah Lozano has a history of hashimoto's, low vitamin D, prediabetes, ?OSA.   4-5 month history of constant right sided neck pain with radiation into right arm to her thumb.    Get flex/ext xrays***  Duration: *** Location: *** Quality: *** Severity: ***  Precipitating: aggravated by *** Modifying factors: made better by *** Weakness: none Timing: *** Bowel/Bladder Dysfunction: none  Conservative measures:  Physical therapy: ***  Multimodal medical therapy including regular antiinflammatories: neurontin, steroid taper, motrin Injections: *** epidural steroid injections  Past Surgery: ***  Leah Lozano has ***no symptoms of cervical myelopathy.  The symptoms are causing a significant impact on the patient's life.   Review of Systems:  A 10 point review of systems is negative, except for the pertinent positives and negatives detailed in the HPI.  Past Medical History: Past Medical History:  Diagnosis Date   Anemia     Past Surgical History: Past Surgical History:  Procedure Laterality Date   CESAREAN SECTION     TONSILLECTOMY      Allergies: Allergies as of 07/10/2023 - Review Complete 10/17/2017  Allergen Reaction Noted   Cedar leaf oil Other (See Comments)    Coconut (cocos nucifera) Cough 08/16/2015   Coconut fragrance  08/16/2015   Codeine Nausea Only 04/03/2016   Hydrocodone Nausea And Vomiting 11/14/2012   Red maple (acer rubrum) allergy skin test Other (See Comments)    Rye grass flower pollen extract  [gramineae pollens] Cough    Other Rash and Other (See Comments)     Medications: Outpatient Encounter Medications as of 07/10/2023  Medication Sig   DOK 100 MG capsule Take 1 capsule by mouth 2 (two) times daily.   enoxaparin  (LOVENOX) 60 MG/0.6ML injection Inject 0.6 mLs into the skin at bedtime.   ferrous sulfate 324 (65 Fe) MG TBEC Take 1 tablet by mouth every morning.   furosemide (LASIX) 20 MG tablet Take 1 tablet (20 mg total) by mouth daily as needed for fluid or edema.   levothyroxine (SYNTHROID, LEVOTHROID) 50 MCG tablet Take 1.5 tablets by mouth daily.   No facility-administered encounter medications on file as of 07/10/2023.    Social History: Social History   Tobacco Use   Smoking status: Some Days   Smokeless tobacco: Never  Substance Use Topics   Alcohol use: No    Family Medical History: Family History  Problem Relation Age of Onset   Hashimoto's thyroiditis Mother    Rosacea Mother    Hypertension Father     Physical Examination: There were no vitals filed for this visit.  General: Patient is well developed, well nourished, calm, collected, and in no apparent distress. Attention to examination is appropriate.  Respiratory: Patient is breathing without any difficulty.   NEUROLOGICAL:     Awake, alert, oriented to person, place, and time.  Speech is clear and fluent. Fund of knowledge is appropriate.   Cranial Nerves: Pupils equal round and reactive to light.  Facial tone is symmetric.    *** ROM of cervical spine *** pain *** posterior cervical tenderness. *** tenderness in bilateral trapezial region.   *** ROM of lumbar spine *** pain *** posterior lumbar tenderness.   No abnormal lesions on exposed skin.   Strength: Side Biceps Triceps Deltoid Interossei Grip Wrist Ext. Wrist Flex.  R 5 5 5 5 5 5 5   L 5 5 5 5 5 5 5    Side Iliopsoas Quads Hamstring PF DF EHL  R 5 5 5 5 5 5   L 5 5 5 5 5 5    Reflexes are ***2+ and symmetric at the biceps, brachioradialis, patella and achilles.   Hoffman's is absent.  Clonus is not present.   Bilateral upper and lower extremity sensation is intact to light touch.     Gait is normal.   ***No difficulty with tandem gait.     Medical Decision Making  Imaging: MRI cervical spine dated 06/22/23:  FINDINGS: Alignment: Straightening with slight reversal of the normal cervical lordosis, apex at C5-6. No significant listhesis.   Vertebrae: Vertebral body height maintained without acute or chronic fracture. Bone marrow signal intensity within normal limits. Small benign hemangioma noted within the T2 vertebral body. No worrisome osseous lesions or abnormal marrow edema.   Cord: Normal signal and morphology.   Posterior Fossa, vertebral arteries, paraspinal tissues: Unremarkable.   Disc levels:   C2-C3: Unremarkable.   C3-C4: Mild diffuse disc bulge with left greater than right uncovertebral spurring. No canal or foraminal stenosis.   C4-C5: Normal interspace. Mild left-sided facet degeneration. No canal or foraminal stenosis.   C5-C6: Right paracentral to foraminal disc protrusion flattens and effaces the right ventral thecal sac (series 109, image 19). Secondary flattening of the right hemi cord without cord signal changes. Mild to moderate right-sided spinal stenosis and right C6 foraminal narrowing. Left neural foramina remains patent.   C6-C7: Small right paracentral to foraminal disc osteophyte complex mildly indents the right ventral thecal sac (series 109, image 23). No significant spinal stenosis. Foramina remain patent.   C7-T1:  Unremarkable.   IMPRESSION: 1. Right paracentral to foraminal disc protrusion at C5-6 with resultant mild to moderate right-sided spinal stenosis with flattening of the right hemi cord, but no cord signal changes. The ventral right C6 nerve root could be affected. 2. Small right paracentral to foraminal disc osteophyte complex at C6-7 without significant stenosis or impingement. 3. Mild noncompressive disc bulging at C3-4 without stenosis.     Electronically Signed   By: Rise Mu M.D.   On: 06/28/2023 17:39        I have personally  reviewed the images and agree with the above interpretation.  Cervical xrays dated 05/22/23:  Cervical spondylosis with slight slip C4-C5.   Radiology report not available for above xrays.   Assessment and Plan: Leah Lozano is a pleasant 32 y.o. female has ***  Treatment options discussed with patient and following plan made:   - Order for physical therapy for *** spine ***. Patient to call to schedule appointment. *** - Continue current medications including ***. Reviewed dosing and side effects.  - Prescription for ***. Reviewed dosing and side effects. Take with food.  - Prescription for *** to take prn muscle spasms. Reviewed dosing and side effects. Discussed this can cause drowsiness.  - MRI of *** to further evaluate *** radiculopathy. No improvement time or medications (***).  - Referral to PMR at [facility] to discuss possible *** injections.  - Will schedule phone visit to review MRI results once I get them back.   I spent a total of *** minutes in face-to-face and non-face-to-face activities related to this patient's care today including review of outside records, review of imaging, review of symptoms, physical exam, discussion of differential diagnosis, discussion of treatment options, and documentation.  Thank you for involving me in the care of this patient.   Drake Leach PA-C Dept. of Neurosurgery

## 2023-07-10 ENCOUNTER — Ambulatory Visit: Payer: BC Managed Care – PPO | Admitting: Orthopedic Surgery

## 2023-07-10 ENCOUNTER — Ambulatory Visit
Admission: RE | Admit: 2023-07-10 | Discharge: 2023-07-10 | Disposition: A | Discharge status: Home / Self Care | Payer: BC Managed Care – PPO | Source: Ambulatory Visit | Attending: Orthopedic Surgery | Admitting: Orthopedic Surgery

## 2023-07-10 ENCOUNTER — Ambulatory Visit
Admission: RE | Admit: 2023-07-10 | Discharge: 2023-07-10 | Disposition: A | Discharge status: Home / Self Care | Payer: BC Managed Care – PPO | Attending: Orthopedic Surgery | Admitting: Orthopedic Surgery

## 2023-07-10 ENCOUNTER — Encounter: Payer: Self-pay | Admitting: Orthopedic Surgery

## 2023-07-10 VITALS — BP 112/70 | Ht 64.75 in | Wt 241.0 lb

## 2023-07-10 DIAGNOSIS — M5020 Other cervical disc displacement, unspecified cervical region: Secondary | ICD-10-CM

## 2023-07-10 DIAGNOSIS — M47812 Spondylosis without myelopathy or radiculopathy, cervical region: Secondary | ICD-10-CM

## 2023-07-10 DIAGNOSIS — M545 Low back pain, unspecified: Secondary | ICD-10-CM

## 2023-07-10 DIAGNOSIS — M5412 Radiculopathy, cervical region: Secondary | ICD-10-CM | POA: Insufficient documentation

## 2023-07-10 DIAGNOSIS — M4722 Other spondylosis with radiculopathy, cervical region: Secondary | ICD-10-CM | POA: Diagnosis not present

## 2023-07-10 DIAGNOSIS — M4802 Spinal stenosis, cervical region: Secondary | ICD-10-CM | POA: Diagnosis not present

## 2023-07-10 DIAGNOSIS — M50222 Other cervical disc displacement at C5-C6 level: Secondary | ICD-10-CM | POA: Diagnosis not present

## 2023-07-10 NOTE — Patient Instructions (Signed)
It was so nice to see you today. Thank you so much for coming in.    You have a disc herniation at C5-C6 and I think this is causing your right arm pin.   I ordered additional xrays of your neck. You can get these at [facility] (building with the white pillars) off of [address]. The address is [address] [place], [place] [address]. You do not need any appointment.   After you have the xrays, it takes 10-14 days for me to get the results back. Once I have them, I will send you a message. I will get your lower back xrays as well and message you about them.   I agree with starting physical therapy as scheduled.   I want you to see physical medicine and rehab at the [facility] to discuss possible injections in your neck. Dr. Yves Dill, Dr. Mariah Milling, and their PA Alphonzo Lemmings are great and will take good care of you. They should call you to schedule an appointment or you can call them at [phone].   We have a phone visit set up after the first of the year. Please do not hesitate to call if you have any questions or concerns. You can also message me in MyChart.   Drake Leach PA-C [phone]     The physicians and staff at [facility] at [facility] are committed to providing excellent care. You may receive a survey asking for feedback about your experience at our office. We value you your feedback and appreciate you taking the time to to fill it out. The [facility] leadership team is also available to discuss your experience in person, feel free to contact us [phone].

## 2023-07-11 ENCOUNTER — Inpatient Hospital Stay
Admission: RE | Admit: 2023-07-11 | Discharge: 2023-07-11 | Disposition: A | Discharge status: Home / Self Care | Payer: Self-pay | Source: Ambulatory Visit | Attending: Orthopedic Surgery | Admitting: Orthopedic Surgery

## 2023-07-11 ENCOUNTER — Other Ambulatory Visit: Payer: Self-pay | Admitting: Family Medicine

## 2023-07-11 DIAGNOSIS — Z049 Encounter for examination and observation for unspecified reason: Secondary | ICD-10-CM

## 2023-07-12 DIAGNOSIS — M545 Low back pain, unspecified: Secondary | ICD-10-CM

## 2023-07-13 ENCOUNTER — Encounter: Payer: Self-pay | Admitting: Orthopedic Surgery

## 2023-07-13 NOTE — Telephone Encounter (Signed)
Cervical xrays dated 07/10/23:  FINDINGS: There is no evidence of cervical spine fracture or prevertebral soft tissue swelling. Alignment is normal. Early degenerative disc disease with small marginal osteophytes and disc space narrowing noted at the C4-5-6 levels. No motion with flexion and extension to suggest instability.   IMPRESSION: Early degenerative changes.     Electronically Signed   By: Layla Maw M.D.   On: 07/12/2023 23:07  I have personally reviewed the images and agree with the above interpretation.  No instability seen. Message to patient. No change in plan we discussed at her visit.

## 2023-09-03 NOTE — Progress Notes (Signed)
   Telephone Visit- Progress Note: Referring Physician:  No referring provider defined for this encounter.  Primary Physician:  Patient, No Pcp Per  This visit was performed via telephone.  Patient location: home Provider location: office  I spent a total of 10 minutes non-face-to-face activities for this visit on the date of this encounter including review of current clinical condition and response to treatment.    Patient has given verbal consent to this telephone visits and we reviewed the limitations of a telephone visit. Patient wishes to proceed.    Chief Complaint:  follow up  History of Present Illness: Leah Lozano is a 33 y.o. female has a history of hashimoto's, low vitamin D, prediabetes, ?OSA.   Last seen by me on 07/10/23 for right sided neck/shoulder and right arm pain. 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 Also with diffuse cervical spondylosis and slight slip C4-C5. No instability on her xrays.   Also with chronic constant mid to lower back pain that feels like a tightness x years. No leg pain. Lumbar xrays looked okay.   She was to start PT for neck and was referred to [facility].   She saw Whitney on 07/17/23 and her neck/right arm pain was improving. She had initial evaluation with PT for her neck on 08/13/23.   Phone visit scheduled for follow up.   She is doing great!   She has no neck or arm pain. No numbness, tingling, or weakness. She has intermittent mid to LBP. No leg pain. Feels more like tightness in her back. No numbness, tingling, or weakness. This is tolerable.   She did one visit of PT and was given HEP for her neck and lower back.   She stopped the neurontin- she had some GI upset and headaches but is feeling better. Has been off of it for a week.    Bowel/Bladder Dysfunction: none   Conservative measures:  Physical therapy: initial eval for neck pain on 08/13/2023 with  [facility]- given  HEP.  Multimodal medical therapy including regular antiinflammatories: neurontin, steroid taper, motrin Injections: no epidural steroid injections is schedule   Past Surgery: no   [name] has no symptoms of cervical myelopathy.   The symptoms are causing a significant impact on the patient's life.   Exam: No exam done as this was a telephone encounter.     Imaging: none  Assessment and Plan: [name] is doing much better. She has no neck or arm pain. No numbness, tingling, or weakness. She has intermittent mid to LBP. No leg pain. Feels more like tightness in her back. No numbness, tingling, or weakness. This is tolerable.   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 Also with diffuse cervical spondylosis and slight slip C4-C5. No instability.   Previous lumbar xrays looked okay.    Treatment options discussed with patient and following plan made:    - Continue with HEP for both her neck and lower back.  - No signs/symptoms of cervical myelopathy. We reviewed these and she will call if they develop.  - She has long history of migraines. They are back to her baseline. Offered referral to neurology and she declined.  - She will let me know if symptoms get worse. Can always revisit injections and/or PT. She will f/u prn.   Glade Boys PA-C Neurosurgery

## 2023-09-07 ENCOUNTER — Ambulatory Visit: Payer: BC Managed Care – PPO | Admitting: Orthopedic Surgery

## 2023-09-07 ENCOUNTER — Encounter: Payer: Self-pay | Admitting: Orthopedic Surgery

## 2023-09-07 DIAGNOSIS — M5412 Radiculopathy, cervical region: Secondary | ICD-10-CM

## 2023-09-07 DIAGNOSIS — M4802 Spinal stenosis, cervical region: Secondary | ICD-10-CM | POA: Diagnosis not present

## 2023-09-07 DIAGNOSIS — M545 Low back pain, unspecified: Secondary | ICD-10-CM

## 2023-09-07 DIAGNOSIS — M5020 Other cervical disc displacement, unspecified cervical region: Secondary | ICD-10-CM

## 2023-09-07 DIAGNOSIS — M50222 Other cervical disc displacement at C5-C6 level: Secondary | ICD-10-CM

## 2023-09-07 DIAGNOSIS — M4722 Other spondylosis with radiculopathy, cervical region: Secondary | ICD-10-CM | POA: Diagnosis not present

## 2023-09-07 DIAGNOSIS — M47812 Spondylosis without myelopathy or radiculopathy, cervical region: Secondary | ICD-10-CM
